# Patient Record
Sex: Female | Born: 1935 | Race: White | Hispanic: No | Smoking: Never smoker
Health system: Southern US, Community
[De-identification: ages and names within clinical notes are randomized; demographics above are authoritative.]

## PROBLEM LIST (undated history)

## (undated) DIAGNOSIS — I10 Essential (primary) hypertension: Secondary | ICD-10-CM

## (undated) DIAGNOSIS — N390 Urinary tract infection, site not specified: Secondary | ICD-10-CM

## (undated) HISTORY — PX: JOINT REPLACEMENT: SHX530

## (undated) HISTORY — PX: ABDOMINAL HYSTERECTOMY: SHX81

---

## 2014-09-23 ENCOUNTER — Emergency Department (HOSPITAL_BASED_OUTPATIENT_CLINIC_OR_DEPARTMENT_OTHER)
Admission: EM | Admit: 2014-09-23 | Discharge: 2014-09-23 | Disposition: A | Discharge status: Home / Self Care | Payer: Medicare Other | Source: Home / Self Care | Attending: Emergency Medicine | Admitting: Emergency Medicine

## 2014-09-23 ENCOUNTER — Emergency Department (HOSPITAL_BASED_OUTPATIENT_CLINIC_OR_DEPARTMENT_OTHER): Payer: Medicare Other

## 2014-09-23 ENCOUNTER — Encounter (HOSPITAL_BASED_OUTPATIENT_CLINIC_OR_DEPARTMENT_OTHER): Payer: Self-pay | Admitting: *Deleted

## 2014-09-23 DIAGNOSIS — Z8505 Personal history of malignant neoplasm of liver: Secondary | ICD-10-CM

## 2014-09-23 DIAGNOSIS — R1010 Upper abdominal pain, unspecified: Secondary | ICD-10-CM | POA: Diagnosis not present

## 2014-09-23 DIAGNOSIS — C221 Intrahepatic bile duct carcinoma: Principal | ICD-10-CM | POA: Diagnosis present

## 2014-09-23 DIAGNOSIS — R109 Unspecified abdominal pain: Secondary | ICD-10-CM | POA: Insufficient documentation

## 2014-09-23 DIAGNOSIS — I10 Essential (primary) hypertension: Secondary | ICD-10-CM | POA: Diagnosis present

## 2014-09-23 DIAGNOSIS — R1030 Lower abdominal pain, unspecified: Secondary | ICD-10-CM

## 2014-09-23 DIAGNOSIS — Z885 Allergy status to narcotic agent status: Secondary | ICD-10-CM

## 2014-09-23 DIAGNOSIS — N390 Urinary tract infection, site not specified: Secondary | ICD-10-CM | POA: Diagnosis present

## 2014-09-23 DIAGNOSIS — Z90710 Acquired absence of both cervix and uterus: Secondary | ICD-10-CM

## 2014-09-23 DIAGNOSIS — Z79899 Other long term (current) drug therapy: Secondary | ICD-10-CM

## 2014-09-23 DIAGNOSIS — K831 Obstruction of bile duct: Secondary | ICD-10-CM | POA: Diagnosis present

## 2014-09-23 DIAGNOSIS — Z87440 Personal history of urinary (tract) infections: Secondary | ICD-10-CM

## 2014-09-23 DIAGNOSIS — R110 Nausea: Secondary | ICD-10-CM

## 2014-09-23 DIAGNOSIS — K5900 Constipation, unspecified: Secondary | ICD-10-CM | POA: Diagnosis present

## 2014-09-23 DIAGNOSIS — Z79891 Long term (current) use of opiate analgesic: Secondary | ICD-10-CM

## 2014-09-23 HISTORY — DX: Essential (primary) hypertension: I10

## 2014-09-23 HISTORY — DX: Urinary tract infection, site not specified: N39.0

## 2014-09-23 LAB — CBC WITH DIFFERENTIAL/PLATELET
BASOS PCT: 0 % (ref 0–1)
Basophils Absolute: 0 10*3/uL (ref 0.0–0.1)
Eosinophils Absolute: 0 10*3/uL (ref 0.0–0.7)
Eosinophils Relative: 0 % (ref 0–5)
HCT: 37.5 % (ref 36.0–46.0)
Hemoglobin: 12.2 g/dL (ref 12.0–15.0)
LYMPHS ABS: 0.8 10*3/uL (ref 0.7–4.0)
LYMPHS PCT: 11 % — AB (ref 12–46)
MCH: 33.4 pg (ref 26.0–34.0)
MCHC: 32.5 g/dL (ref 30.0–36.0)
MCV: 102.7 fL — ABNORMAL HIGH (ref 78.0–100.0)
Monocytes Absolute: 0.7 10*3/uL (ref 0.1–1.0)
Monocytes Relative: 9 % (ref 3–12)
NEUTROS ABS: 5.8 10*3/uL (ref 1.7–7.7)
Neutrophils Relative %: 80 % — ABNORMAL HIGH (ref 43–77)
Platelets: 209 10*3/uL (ref 150–400)
RBC: 3.65 MIL/uL — ABNORMAL LOW (ref 3.87–5.11)
RDW: 12.4 % (ref 11.5–15.5)
WBC: 7.3 10*3/uL (ref 4.0–10.5)

## 2014-09-23 LAB — COMPREHENSIVE METABOLIC PANEL
ALBUMIN: 3.2 g/dL — AB (ref 3.5–5.0)
ALK PHOS: 448 U/L — AB (ref 38–126)
ALT: 139 U/L — ABNORMAL HIGH (ref 14–54)
AST: 152 U/L — ABNORMAL HIGH (ref 15–41)
Anion gap: 8 (ref 5–15)
BILIRUBIN TOTAL: 1.3 mg/dL — AB (ref 0.3–1.2)
BUN: 15 mg/dL (ref 6–20)
CO2: 28 mmol/L (ref 22–32)
Calcium: 8.9 mg/dL (ref 8.9–10.3)
Chloride: 99 mmol/L — ABNORMAL LOW (ref 101–111)
Creatinine, Ser: 0.71 mg/dL (ref 0.44–1.00)
GFR calc non Af Amer: >60 mL/min (ref >60)
Glucose, Bld: 127 mg/dL — ABNORMAL HIGH (ref 65–99)
Potassium: 4.1 mmol/L (ref 3.5–5.1)
Sodium: 135 mmol/L (ref 135–145)
TOTAL PROTEIN: 6.8 g/dL (ref 6.5–8.1)

## 2014-09-23 LAB — LIPASE, BLOOD: Lipase: 11 U/L — ABNORMAL LOW (ref 22–51)

## 2014-09-23 MED ORDER — HYDROCODONE-ACETAMINOPHEN 5-325 MG PO TABS: 1.0000 | ORAL_TABLET | Freq: Four times a day (QID) | ORAL | Status: DC | PRN

## 2014-09-23 MED ORDER — SODIUM CHLORIDE 0.9 % IV SOLN
INTRAVENOUS | Status: DC
  Administered 2014-09-23: 08:00:00 via INTRAVENOUS

## 2014-09-23 MED ORDER — FENTANYL CITRATE (PF) 100 MCG/2ML IJ SOLN
25.0000 ug | Freq: Once | INTRAMUSCULAR | Status: AC
  Administered 2014-09-23: 25 ug via INTRAVENOUS
  Filled 2014-09-23: qty 2

## 2014-09-23 MED ORDER — IOHEXOL 300 MG/ML  SOLN
100.0000 mL | Freq: Once | INTRAMUSCULAR | Status: AC | PRN
  Administered 2014-09-23: 100 mL via INTRAVENOUS

## 2014-09-23 MED ORDER — SODIUM CHLORIDE 0.9 % IV BOLUS (SEPSIS)
500.0000 mL | Freq: Once | INTRAVENOUS | Status: AC
Start: 2014-09-23 — End: 2014-09-23
  Administered 2014-09-23: 500 mL via INTRAVENOUS

## 2014-09-23 MED ORDER — ONDANSETRON HCL 4 MG/2ML IJ SOLN
4.0000 mg | Freq: Once | INTRAMUSCULAR | Status: AC
  Administered 2014-09-23: 4 mg via INTRAVENOUS
  Filled 2014-09-23: qty 2

## 2014-09-23 MED ORDER — ONDANSETRON 4 MG PO TBDP: 4.0000 mg | ORAL_TABLET | Freq: Three times a day (TID) | ORAL | Status: AC | PRN

## 2014-09-23 NOTE — ED Notes (Signed)
Family at bedside. 

## 2014-09-23 NOTE — ED Notes (Signed)
C/o pain in right lower quad to mid abd and nausea since yesterday evening. No v/d. No fever. No urinary problems

## 2014-09-23 NOTE — ED Provider Notes (Signed)
CSN: [id number]     Arrival date & time 09/23/14  0700 History   First MD Initiated Contact with Patient 09/23/14 0719     No chief complaint on file.    (Consider location/radiation/quality/duration/timing/severity/associated sxs/prior Treatment) The history is provided by the patient.   81 year old female with acute onset of right-sided abdominal pain last evening. Been constant and persistent associated with nausea no vomiting. No fevers. Patient states that the pain is 8 out of 10. Kind of achy in nature but sometimes sharp. Patient's had a hysterectomy. Gallbladder is still present.  Past Medical History  Diagnosis Date  . UTI (urinary tract infection)   . Hypertension    Past Surgical History  Procedure Laterality Date  . Abdominal hysterectomy    . Joint replacement     No family history on file. History  Substance Use Topics  . Smoking status: Never Smoker   . Smokeless tobacco: Not on file  . Alcohol Use: Not on file   OB History    No data available     Review of Systems  Constitutional: Negative for fever.  HENT: Negative for congestion.   Eyes: Negative for redness.  Respiratory: Negative for shortness of breath.   Cardiovascular: Negative for chest pain.  Gastrointestinal: Positive for nausea and abdominal pain. Negative for vomiting and diarrhea.  Genitourinary: Negative for dysuria.  Musculoskeletal: Negative for back pain.  Skin: Negative for rash.  Neurological: Negative for headaches.  Hematological: Does not bruise/bleed easily.  Psychiatric/Behavioral: Negative for confusion.      Allergies  Hydrocodone bitartrate er  Home Medications   Prior to Admission medications   Medication Sig Start Date End Date Taking? Authorizing Provider  HYDROcodone-acetaminophen (NORCO/VICODIN) 5-325 MG per tablet Take 1-2 tablets by mouth every 6 (six) hours as needed for moderate pain. 09/23/14   Fredia Sorrow, MD  ondansetron (ZOFRAN ODT) 4 MG disintegrating  tablet Take 1 tablet (4 mg total) by mouth every 8 (eight) hours as needed for nausea or vomiting. 09/23/14   Fredia Sorrow, MD   BP 173/67 mmHg  Pulse 61  Temp(Src) 98.3 F (36.8 C) (Oral)  Resp 18  Ht 5\' 7"  (1.702 m)  Wt 183 lb (83.008 kg)  BMI 28.66 kg/m2  SpO2 100% Physical Exam  Constitutional: She is oriented to person, place, and time. She appears well-developed and well-nourished. No distress.  HENT:  Head: Normocephalic and atraumatic.  Mouth/Throat: Oropharynx is clear and moist.  Eyes: Conjunctivae and EOM are normal. Pupils are equal, round, and reactive to light.  Neck: Normal range of motion.  Cardiovascular: Normal rate, regular rhythm and normal heart sounds.   Pulmonary/Chest: Effort normal and breath sounds normal. No respiratory distress.  Abdominal: Soft. Bowel sounds are normal. There is tenderness.  Mild tenderness right side of the abdomen not truly right upper quadrant not truly right lower quadrant. No guarding. No tenderness over palpation of the liver.  Musculoskeletal: Normal range of motion.  Neurological: She is alert and oriented to person, place, and time. No cranial nerve deficit. She exhibits normal muscle tone. Coordination normal.  Skin: Skin is warm. No rash noted.  Nursing note and vitals reviewed.   ED Course  Procedures (including critical care time) Labs Review Labs Reviewed  LIPASE, BLOOD - Abnormal; Notable for the following:    Lipase 11 (*)    All other components within normal limits  COMPREHENSIVE METABOLIC PANEL - Abnormal; Notable for the following:    Chloride 99 (*)  Glucose, Bld 127 (*)    Albumin 3.2 (*)    AST 152 (*)    ALT 139 (*)    Alkaline Phosphatase 448 (*)    Total Bilirubin 1.3 (*)    All other components within normal limits  CBC WITH DIFFERENTIAL/PLATELET - Abnormal; Notable for the following:    RBC 3.65 (*)    MCV 102.7 (*)    Neutrophils Relative % 80 (*)    Lymphocytes Relative 11 (*)    All other  components within normal limits   Results for orders placed or performed during the hospital encounter of 09/23/14  Lipase, blood  Result Value Ref Range   Lipase 11 (L) 22 - 51 U/L  Comprehensive metabolic panel  Result Value Ref Range   Sodium 135 135 - 145 mmol/L   Potassium 4.1 3.5 - 5.1 mmol/L   Chloride 99 (L) 101 - 111 mmol/L   CO2 28 22 - 32 mmol/L   Glucose, Bld 127 (H) 65 - 99 mg/dL   BUN 15 6 - 20 mg/dL   Creatinine, Ser 0.71 0.44 - 1.00 mg/dL   Calcium 8.9 8.9 - 10.3 mg/dL   Total Protein 6.8 6.5 - 8.1 g/dL   Albumin 3.2 (L) 3.5 - 5.0 g/dL   AST 152 (H) 15 - 41 U/L   ALT 139 (H) 14 - 54 U/L   Alkaline Phosphatase 448 (H) 38 - 126 U/L   Total Bilirubin 1.3 (H) 0.3 - 1.2 mg/dL   GFR calc non Af Amer >60 >60 mL/min   GFR calc Af Amer >60 >60 mL/min   Anion gap 8 5 - 15  CBC with Differential/Platelet  Result Value Ref Range   WBC 7.3 4.0 - 10.5 K/uL   RBC 3.65 (L) 3.87 - 5.11 MIL/uL   Hemoglobin 12.2 12.0 - 15.0 g/dL   HCT 37.5 36.0 - 46.0 %   MCV 102.7 (H) 78.0 - 100.0 fL   MCH 33.4 26.0 - 34.0 pg   MCHC 32.5 30.0 - 36.0 g/dL   RDW 12.4 11.5 - 15.5 %   Platelets 209 150 - 400 K/uL   Neutrophils Relative % 80 (H) 43 - 77 %   Neutro Abs 5.8 1.7 - 7.7 K/uL   Lymphocytes Relative 11 (L) 12 - 46 %   Lymphs Abs 0.8 0.7 - 4.0 K/uL   Monocytes Relative 9 3 - 12 %   Monocytes Absolute 0.7 0.1 - 1.0 K/uL   Eosinophils Relative 0 0 - 5 %   Eosinophils Absolute 0.0 0.0 - 0.7 K/uL   Basophils Relative 0 0 - 1 %   Basophils Absolute 0.0 0.0 - 0.1 K/uL     Imaging Review Ct Abdomen Pelvis W Contrast  09/23/2014   CLINICAL DATA:  Right lower quadrant abdominal pain with nausea since last night. Initial encounter.  EXAM: CT ABDOMEN AND PELVIS WITH CONTRAST  TECHNIQUE: Multidetector CT imaging of the abdomen and pelvis was performed using the standard protocol following bolus administration of intravenous contrast.  CONTRAST:  152mL OMNIPAQUE IOHEXOL 300 MG/ML  SOLN   COMPARISON:  None.  FINDINGS: Lower chest: Clear lung bases. No significant pleural or pericardial effusion.  Hepatobiliary: There is prominent segmental intrahepatic biliary dilatation anteriorly in the right hepatic lobe. There is heterogeneous enhancement within segments 4B and 5, measuring 5.1 x 3.6 cm on image 24, likely altered perfusion from the biliary dilatation as the dilated bile ducts pass through this lesion. A peripheral hypervascular mass is  considered less likely. There is suspicion of a central liver mass causing biliary obstruction, potentially intraductal. This is most obvious on sagittal images 44 through 52. No other focal hepatic lesions identified. The gallbladder appears normal without gallstones or wall thickening. The extrahepatic biliary system is nondilated.  Pancreas: Unremarkable. No pancreatic ductal dilatation or surrounding inflammatory changes.  Spleen: Normal in size without focal abnormality.  Adrenals/Urinary Tract: Both adrenal glands appear normal.Both kidneys demonstrate parapelvic cysts. There is no evidence of renal mass, hydronephrosis or urinary tract calculus. The bladder appears unremarkable.  Stomach/Bowel: No evidence of bowel wall thickening, distention or surrounding inflammatory change.There is a possible small appendiceal stump suggesting previous appendectomy. No pericecal inflammatory changes identified.  Vascular/Lymphatic: Several small lymph nodes within the porta hepatis are not pathologically enlarged and are nonspecific. There is no retroperitoneal or pelvic lymphadenopathy. There is moderate atherosclerosis of the aorta, its branches and the iliac arteries.  Reproductive: Status post hysterectomy. No evidence of adnexal mass.  Other: No evidence of abdominal wall mass or hernia.  Musculoskeletal: No acute or significant osseous findings. Mild lumbar spondylosis noted.  IMPRESSION: 1. Segmental intrahepatic biliary dilatation highly suspicious for central  cholangiocarcinoma. Focal hypervascular lesion within segments 4B and 5 is likely due to altered perfusion, although potentially a peripheral mass lesion. Further evaluation with MRCP without and with contrast recommended. 2. No distant metastases identified. There are not specific small lymph nodes within the porta hepatis. 3. No acute inflammatory changes identified. Suspected previous appendectomy at time of hysterectomy. 4. Moderate atherosclerosis.   Electronically Signed   By: Richardean Sale M.D.   On: 09/23/2014 09:23     EKG Interpretation   Date/Time:  Thursday September 23 2014 08:07:08 EDT Ventricular Rate:  58 PR Interval:  168 QRS Duration: 132 QT Interval:  476 QTC Calculation: 467 R Axis:   80 Text Interpretation:  Sinus bradycardia Right bundle branch block Abnormal  ECG No previous ECGs available Confirmed by Elantra Caprara  MD, Mayleen Borrero ([id number])  on 09/23/2014 8:32:50 AM      MDM   Final diagnoses:  Abdominal pain   Additional history obtained from the daughter. Patient is having discomfort on the right side of the abdomen right upper quadrant for several months. Patient's primary care doctor had done an ultrasound of the gallbladder area without any acute findings. Patient's had elevated and abnormal liver function tests now for several months. Patient's had pain in this area but just worse and more constant starting last night.  CT of the area raises concerns for intrahepatic biliary dilatation suspicious for central cholangiocarcinoma. No lymph nodes in the area. Patient does have a GI doctor in [facility]. Patient will call them for either ERCP or perhaps MRI of the area. Patient will also notify her primary care doctor.  Patient will be treated with pain medicine and antinausea medicine.  No evidence of any gallstones no evidence of appendicitis. Actually appendix probably was removed when they did her hysterectomy.     Fredia Sorrow, MD 09/23/14 1007

## 2014-09-23 NOTE — ED Notes (Signed)
Patient is resting comfortably. 

## 2014-09-23 NOTE — Discharge Instructions (Signed)
Take the pain medication and antinausea medicine as directed. And as needed.  Call your GI doctor for follow-up. Probably need an ERCP. Also notify your primary care doctor at the CT findings.

## 2014-09-24 ENCOUNTER — Encounter (HOSPITAL_COMMUNITY): Payer: Self-pay | Admitting: *Deleted

## 2014-09-24 ENCOUNTER — Inpatient Hospital Stay (HOSPITAL_COMMUNITY)
Admission: EM | Admit: 2014-09-24 | Discharge: 2014-09-28 | DRG: 435 | Disposition: A | Discharge status: Home Health | Payer: Medicare Other | Attending: Internal Medicine | Admitting: Internal Medicine

## 2014-09-24 DIAGNOSIS — I10 Essential (primary) hypertension: Secondary | ICD-10-CM | POA: Diagnosis present

## 2014-09-24 DIAGNOSIS — R7989 Other specified abnormal findings of blood chemistry: Secondary | ICD-10-CM | POA: Insufficient documentation

## 2014-09-24 DIAGNOSIS — K831 Obstruction of bile duct: Secondary | ICD-10-CM | POA: Diagnosis present

## 2014-09-24 DIAGNOSIS — N390 Urinary tract infection, site not specified: Secondary | ICD-10-CM | POA: Diagnosis present

## 2014-09-24 DIAGNOSIS — Z79899 Other long term (current) drug therapy: Secondary | ICD-10-CM | POA: Diagnosis not present

## 2014-09-24 DIAGNOSIS — K5900 Constipation, unspecified: Secondary | ICD-10-CM | POA: Diagnosis present

## 2014-09-24 DIAGNOSIS — R945 Abnormal results of liver function studies: Secondary | ICD-10-CM

## 2014-09-24 DIAGNOSIS — C221 Intrahepatic bile duct carcinoma: Secondary | ICD-10-CM | POA: Diagnosis present

## 2014-09-24 DIAGNOSIS — R1110 Vomiting, unspecified: Secondary | ICD-10-CM | POA: Diagnosis not present

## 2014-09-24 DIAGNOSIS — R1010 Upper abdominal pain, unspecified: Secondary | ICD-10-CM

## 2014-09-24 DIAGNOSIS — R1084 Generalized abdominal pain: Secondary | ICD-10-CM | POA: Diagnosis not present

## 2014-09-24 DIAGNOSIS — Z8505 Personal history of malignant neoplasm of liver: Secondary | ICD-10-CM | POA: Diagnosis not present

## 2014-09-24 DIAGNOSIS — R112 Nausea with vomiting, unspecified: Secondary | ICD-10-CM | POA: Diagnosis present

## 2014-09-24 DIAGNOSIS — R1011 Right upper quadrant pain: Secondary | ICD-10-CM

## 2014-09-24 DIAGNOSIS — Z885 Allergy status to narcotic agent status: Secondary | ICD-10-CM | POA: Diagnosis not present

## 2014-09-24 DIAGNOSIS — Z79891 Long term (current) use of opiate analgesic: Secondary | ICD-10-CM | POA: Diagnosis not present

## 2014-09-24 DIAGNOSIS — R109 Unspecified abdominal pain: Secondary | ICD-10-CM | POA: Diagnosis present

## 2014-09-24 LAB — CBC WITH DIFFERENTIAL/PLATELET
BASOS PCT: 0 % (ref 0–1)
Basophils Absolute: 0 10*3/uL (ref 0.0–0.1)
Eosinophils Absolute: 0 10*3/uL (ref 0.0–0.7)
Eosinophils Relative: 0 % (ref 0–5)
HCT: 39 % (ref 36.0–46.0)
Hemoglobin: 13.1 g/dL (ref 12.0–15.0)
LYMPHS ABS: 1.1 10*3/uL (ref 0.7–4.0)
LYMPHS PCT: 11 % — AB (ref 12–46)
MCH: 34 pg (ref 26.0–34.0)
MCHC: 33.6 g/dL (ref 30.0–36.0)
MCV: 101.3 fL — AB (ref 78.0–100.0)
MONOS PCT: 13 % — AB (ref 3–12)
Monocytes Absolute: 1.3 10*3/uL — ABNORMAL HIGH (ref 0.1–1.0)
Neutro Abs: 7.5 10*3/uL (ref 1.7–7.7)
Neutrophils Relative %: 76 % (ref 43–77)
Platelets: 217 10*3/uL (ref 150–400)
RBC: 3.85 MIL/uL — ABNORMAL LOW (ref 3.87–5.11)
RDW: 13 % (ref 11.5–15.5)
WBC: 9.9 10*3/uL (ref 4.0–10.5)

## 2014-09-24 LAB — URINALYSIS, ROUTINE W REFLEX MICROSCOPIC
Glucose, UA: NEGATIVE mg/dL
Hgb urine dipstick: NEGATIVE
KETONES UR: 15 mg/dL — AB
NITRITE: POSITIVE — AB
PH: 5.5 (ref 5.0–8.0)
PROTEIN: NEGATIVE mg/dL
Specific Gravity, Urine: 1.019 (ref 1.005–1.030)
Urobilinogen, UA: 4 mg/dL — ABNORMAL HIGH (ref 0.0–1.0)

## 2014-09-24 LAB — COMPREHENSIVE METABOLIC PANEL
ALBUMIN: 3 g/dL — AB (ref 3.5–5.0)
ALK PHOS: 440 U/L — AB (ref 38–126)
ALT: 141 U/L — ABNORMAL HIGH (ref 14–54)
ANION GAP: 10 (ref 5–15)
AST: 159 U/L — AB (ref 15–41)
BUN: 9 mg/dL (ref 6–20)
CO2: 28 mmol/L (ref 22–32)
CREATININE: 0.78 mg/dL (ref 0.44–1.00)
Calcium: 9.1 mg/dL (ref 8.9–10.3)
Chloride: 95 mmol/L — ABNORMAL LOW (ref 101–111)
GFR calc non Af Amer: >60 mL/min (ref >60)
GLUCOSE: 115 mg/dL — AB (ref 65–99)
POTASSIUM: 4 mmol/L (ref 3.5–5.1)
Sodium: 133 mmol/L — ABNORMAL LOW (ref 135–145)
TOTAL PROTEIN: 6.6 g/dL (ref 6.5–8.1)
Total Bilirubin: 4.1 mg/dL — ABNORMAL HIGH (ref 0.3–1.2)

## 2014-09-24 LAB — URINE MICROSCOPIC-ADD ON

## 2014-09-24 LAB — TSH: TSH: 2.065 u[IU]/mL (ref 0.350–4.500)

## 2014-09-24 LAB — LIPASE, BLOOD: Lipase: 16 U/L — ABNORMAL LOW (ref 22–51)

## 2014-09-24 MED ORDER — ONDANSETRON HCL 4 MG/2ML IJ SOLN
4.0000 mg | Freq: Four times a day (QID) | INTRAMUSCULAR | Status: DC | PRN
  Administered 2014-09-24 – 2014-09-28 (×7): 4 mg via INTRAVENOUS
  Filled 2014-09-24 (×8): qty 2

## 2014-09-24 MED ORDER — ACETAMINOPHEN 325 MG PO TABS: 650.0000 mg | ORAL_TABLET | Freq: Four times a day (QID) | ORAL | Status: DC | PRN

## 2014-09-24 MED ORDER — ONDANSETRON HCL 4 MG/2ML IJ SOLN
4.0000 mg | Freq: Once | INTRAMUSCULAR | Status: AC
  Administered 2014-09-24: 4 mg via INTRAVENOUS
  Filled 2014-09-24: qty 2

## 2014-09-24 MED ORDER — ONDANSETRON HCL 4 MG PO TABS: 4.0000 mg | ORAL_TABLET | Freq: Four times a day (QID) | ORAL | Status: DC | PRN

## 2014-09-24 MED ORDER — MORPHINE SULFATE 4 MG/ML IJ SOLN
4.0000 mg | Freq: Once | INTRAMUSCULAR | Status: AC
  Administered 2014-09-24: 4 mg via INTRAVENOUS
  Filled 2014-09-24: qty 1

## 2014-09-24 MED ORDER — OXYCODONE HCL 5 MG PO TABS
5.0000 mg | ORAL_TABLET | ORAL | Status: DC | PRN
  Administered 2014-09-25 – 2014-09-26 (×5): 5 mg via ORAL
  Filled 2014-09-24 (×5): qty 1

## 2014-09-24 MED ORDER — HYDROMORPHONE HCL 1 MG/ML IJ SOLN
1.0000 mg | INTRAMUSCULAR | Status: DC | PRN
  Administered 2014-09-24 – 2014-09-28 (×16): 1 mg via INTRAVENOUS
  Filled 2014-09-24 (×16): qty 1

## 2014-09-24 MED ORDER — VERAPAMIL HCL 180 MG (CO) PO TB24: 180.0000 mg | ORAL_TABLET | Freq: Every day | ORAL | Status: DC

## 2014-09-24 MED ORDER — VERAPAMIL HCL ER 180 MG PO TBCR
180.0000 mg | EXTENDED_RELEASE_TABLET | Freq: Every day | ORAL | Status: DC
  Administered 2014-09-24 – 2014-09-27 (×4): 180 mg via ORAL
  Filled 2014-09-24 (×5): qty 1

## 2014-09-24 MED ORDER — SODIUM CHLORIDE 0.9 % IV SOLN
INTRAVENOUS | Status: DC
  Administered 2014-09-24 – 2014-09-27 (×3): via INTRAVENOUS

## 2014-09-24 MED ORDER — ACETAMINOPHEN 650 MG RE SUPP: 650.0000 mg | Freq: Four times a day (QID) | RECTAL | Status: DC | PRN

## 2014-09-24 MED ORDER — ALUM & MAG HYDROXIDE-SIMETH 200-200-20 MG/5ML PO SUSP: 30.0000 mL | Freq: Four times a day (QID) | ORAL | Status: DC | PRN

## 2014-09-24 NOTE — ED Notes (Signed)
Pt reports upper abd pain x 2 days with nausea, no vomiting. Was here yesterday for same and reports being diagnosed with possible liver cancer.

## 2014-09-24 NOTE — Consult Note (Signed)
Referring Provider: Dr. Darl Householder Primary Care Physician:  Idamae Schuller, MD Primary Gastroenterologist:  Althia Forts  Reason for Consultation:  Cholangiocarcinoma; Elevated LFTs; Abdominal pain  HPI: Krista Bentley is a pleasant 81 y.o. female who has been having pruritus for the past 2 months and a 10 pound unintentional weight loss over the past 12 months. Starting the first of this week she has been having epigastric and RUQ pain that has been a constant ache. +Nausea this week without vomiting. +Weakness all week. Poor appetite over the past 2 weeks. Denies fevers or chills. Denies jaundice until today and also noticed tea-colored urine today. Has developed constipation over the last few weeks and has been taking a stool softener to help with that. Denies melena, hematochezia. Was in the ER yesterday and TB 1.3, ALP 448, AST 152, ALT 139 and a CT scan showed segmental intrahepatic biliary dilation concerning for a central cholangiocarcinoma. She came back to the hospital today due to worsening abdominal pain and her TB had increased to 4.1, ALP 440, AST 159, ALT 141. Lipase normal at 16.  +Heartburn that is controlled with Protonix 40 mg BID. Denies dysphagia. Reports chest pain in the ER pointing to her left lower chest but denies any now. Has been having shortness of breath for the last few months. Reports having a normal EGD in [place] a few months ago that was done for dysphagia but denies any dysphagia since around that time. Reports 1 or 2 benign polyps being taken out on a colonoscopy a few months ago. Denies family history of liver cancer or biliary tract cancer.    Past Medical History  Diagnosis Date  . UTI (urinary tract infection)   . Hypertension     Past Surgical History  Procedure Laterality Date  . Abdominal hysterectomy    . Joint replacement      Prior to Admission medications   Medication Sig Start Date End Date Taking? Authorizing Provider  dicyclomine (BENTYL) 10 MG  capsule Take 10 mg by mouth at bedtime.   Yes Historical Provider, MD  estrogens, conjugated, (PREMARIN) 0.625 MG tablet Take 0.625 mg by mouth See admin instructions. Take daily for 21 days then do not take for 7 days.. Take on Mondays wednesdays and fridays   Yes Historical Provider, MD  meloxicam (MOBIC) 7.5 MG tablet Take 7.5 mg by mouth at bedtime.   Yes Historical Provider, MD  ondansetron (ZOFRAN ODT) 4 MG disintegrating tablet Take 1 tablet (4 mg total) by mouth every 8 (eight) hours as needed for nausea or vomiting. 09/23/14  Yes Fredia Sorrow, MD  pantoprazole (PROTONIX) 40 MG tablet Take 40 mg by mouth 2 (two) times daily.   Yes Historical Provider, MD  simvastatin (ZOCOR) 40 MG tablet Take 40 mg by mouth every evening.   Yes Historical Provider, MD  traMADol (ULTRAM) 50 MG tablet Take 50 mg by mouth 2 (two) times daily as needed for moderate pain.   Yes Historical Provider, MD  verapamil (COVERA HS) 180 MG (CO) 24 hr tablet Take 180 mg by mouth at bedtime.   Yes Historical Provider, MD  HYDROcodone-acetaminophen (NORCO/VICODIN) 5-325 MG per tablet Take 1-2 tablets by mouth every 6 (six) hours as needed for moderate pain. Patient not taking: Reported on 09/24/2014 09/23/14   Fredia Sorrow, MD  ondansetron (ZOFRAN) 4 MG tablet Take 4 mg by mouth every 8 (eight) hours as needed for nausea or vomiting.    Historical Provider, MD    Scheduled Meds: . verapamil  180 mg Oral QHS   Continuous Infusions: . sodium chloride 75 mL/hr at 09/24/14 2034   PRN Meds:.acetaminophen **OR** acetaminophen, alum & mag hydroxide-simeth, HYDROmorphone (DILAUDID) injection, ondansetron **OR** ondansetron (ZOFRAN) IV, oxyCODONE  Allergies as of 09/24/2014 - Review Complete 09/24/2014  Allergen Reaction Noted  . Hydrocodone bitartrate er Rash 09/23/2014    History reviewed. No pertinent family history.  History   Social History  . Marital Status: Unknown    Spouse Name: N/A  . Number of Children:  N/A  . Years of Education: N/A   Occupational History  . Not on file.   Social History Main Topics  . Smoking status: Never Smoker   . Smokeless tobacco: Not on file  . Alcohol Use: Not on file  . Drug Use: Not on file  . Sexual Activity: Not on file   Other Topics Concern  . Not on file   Social History Narrative    Review of Systems: Positive in bold: Gen: Denies any fever, chills, rigors, anorexia, fatigue, weakness, malaise, involuntary weight loss CV: Denies chest pain Resp: Denies dyspnea, cough GI: negative except as stated above in HPI  GU : +tea-colored urine MS: Denies joint pain or swelling.  Derm: Denies rash, itching Psych: Denies confusion. Heme: Denies bruising, bleeding Neuro:  Denies any headaches, dizziness Endo:  Denies any problems with DM  Physical Exam: Vital signs in last 24 hours: Temp:  [98.2 F (36.8 C)] 98.2 F (36.8 C) (06/10 1552) Pulse Rate:  [59-68] 64 (06/10 1857) Resp:  [11-24] 18 (06/10 1857) BP: (160-182)/(54-69) 181/63 mmHg (06/10 1857) SpO2:  [94 %-99 %] 97 % (06/10 1857) Weight:  [83.008 kg (183 lb)] 83.008 kg (183 lb) (06/10 1552)   General:   Lethargic, Well-developed, well-nourished, pleasant and cooperative in NAD Head:  Normocephalic and atraumatic. Eyes:  +scleral icterus.   Conjunctiva pink. Ears:  Normal auditory acuity. Nose:  No deformity, discharge,  or lesions. Mouth:  No deformity or lesions.  Oropharynx pink & moist. Neck:  Supple; no masses or thyromegaly. Lungs:  Clear throughout to auscultation.   No wheezes, crackles, or rhonchi. No acute distress. Heart:  Regular rate and rhythm; no murmurs, clicks, rubs,  or gallops. Abdomen:  Epigastric and RUQ tenderness with guarding, lower abdominal tenderness without guarding, soft, nondistended, +BS, no rebound, hepatic edge not palpable   Rectal:  Deferred Msk:  Symmetrical without gross deformities. Normal posture. Pulses:  Normal pulses noted. Extremities:   Without clubbing or edema. Neurologic:  Alert and  oriented x4;  grossly normal neurologically. Skin:  Intact without significant lesions or rashes. Psych:  Alert and cooperative. Normal mood and affect.   Lab Results:  Recent Labs  09/23/14 0750 09/24/14 1612  WBC 7.3 9.9  HGB 12.2 13.1  HCT 37.5 39.0  PLT 209 217   BMET  Recent Labs  09/23/14 0750 09/24/14 1612  NA 135 133*  K 4.1 4.0  CL 99* 95*  CO2 28 28  GLUCOSE 127* 115*  BUN 15 9  CREATININE 0.71 0.78  CALCIUM 8.9 9.1   LFT Lab Results  Component Value Date   ALT 141* 09/24/2014   AST 159* 09/24/2014   ALKPHOS 440* 09/24/2014   BILITOT 4.1* 09/24/2014    Recent Labs  09/24/14 1612  PROT 6.6  ALBUMIN 3.0*  AST 159*  ALT 141*  ALKPHOS 440*  BILITOT 4.1*   PT/INR No results for input(s): LABPROT, INR in the last 72 hours.   Studies/Results: Ct Abdomen  Pelvis W Contrast  09/23/2014   CLINICAL DATA:  Right lower quadrant abdominal pain with nausea since last night. Initial encounter.  EXAM: CT ABDOMEN AND PELVIS WITH CONTRAST  TECHNIQUE: Multidetector CT imaging of the abdomen and pelvis was performed using the standard protocol following bolus administration of intravenous contrast.  CONTRAST:  156mL OMNIPAQUE IOHEXOL 300 MG/ML  SOLN  COMPARISON:  None.  FINDINGS: Lower chest: Clear lung bases. No significant pleural or pericardial effusion.  Hepatobiliary: There is prominent segmental intrahepatic biliary dilatation anteriorly in the right hepatic lobe. There is heterogeneous enhancement within segments 4B and 5, measuring 5.1 x 3.6 cm on image 24, likely altered perfusion from the biliary dilatation as the dilated bile ducts pass through this lesion. A peripheral hypervascular mass is considered less likely. There is suspicion of a central liver mass causing biliary obstruction, potentially intraductal. This is most obvious on sagittal images 44 through 52. No other focal hepatic lesions identified. The  gallbladder appears normal without gallstones or wall thickening. The extrahepatic biliary system is nondilated.  Pancreas: Unremarkable. No pancreatic ductal dilatation or surrounding inflammatory changes.  Spleen: Normal in size without focal abnormality.  Adrenals/Urinary Tract: Both adrenal glands appear normal.Both kidneys demonstrate parapelvic cysts. There is no evidence of renal mass, hydronephrosis or urinary tract calculus. The bladder appears unremarkable.  Stomach/Bowel: No evidence of bowel wall thickening, distention or surrounding inflammatory change.There is a possible small appendiceal stump suggesting previous appendectomy. No pericecal inflammatory changes identified.  Vascular/Lymphatic: Several small lymph nodes within the porta hepatis are not pathologically enlarged and are nonspecific. There is no retroperitoneal or pelvic lymphadenopathy. There is moderate atherosclerosis of the aorta, its branches and the iliac arteries.  Reproductive: Status post hysterectomy. No evidence of adnexal mass.  Other: No evidence of abdominal wall mass or hernia.  Musculoskeletal: No acute or significant osseous findings. Mild lumbar spondylosis noted.  IMPRESSION: 1. Segmental intrahepatic biliary dilatation highly suspicious for central cholangiocarcinoma. Focal hypervascular lesion within segments 4B and 5 is likely due to altered perfusion, although potentially a peripheral mass lesion. Further evaluation with MRCP without and with contrast recommended. 2. No distant metastases identified. There are not specific small lymph nodes within the porta hepatis. 3. No acute inflammatory changes identified. Suspected previous appendectomy at time of hysterectomy. 4. Moderate atherosclerosis.   Electronically Signed   By: Richardean Sale M.D.   On: 09/23/2014 09:23    Impression/Plan: 81 yo with obstructive jaundice and CT concerning for cholangiocarcinoma. Total bilirubin has increased from yesterday but other  LFTs have not changed. MRCP is needed to further evaluate the biliary tree and this central mass. If her LFTs continue to rise, then may need an ERCP with stent placement but if her LFTs improve, then could do ERCP as an outpt. Risks/benefits of an ERCP were discussed with the patient in case it is necessary and she agrees to proceed if necessary but at this time will do MRCP and follow LFTs. Supportive care. Clear liquid diet. NPO p MN until MRCP has been done. Thank you for this consultation.    LOS: 0 days   Benton C.  09/24/2014, 9:06 PM  Pager [phone]  If no answer or after 5 PM call [phone]

## 2014-09-24 NOTE — H&P (Signed)
[facility] History and Physical  Krista Bentley BDZ:[id number] DOB: April 27, 1933 DOA: 09/24/2014  Referring physician:  PCP: Idamae Schuller, MD   Chief Complaint: Abdominal pain  HPI: Krista Bentley is a 81 y.o. female patient is a pleasant 81 year old female with past medical history of hypertension presenting to the emergency department with complaints of abdominal pain. She reports expansion abdominal pain for the past 5 days located in the epigastric and right upper quadrant regions. She describes this pain as constant, crampy-like, without alleviating or precipitating factors. She complains of associated nausea though denies significant vomiting. She also complains of having generalized pruritus. She states losing about 10 pounds over the past year, denies fevers, chills, hematemesis, bloody stools, melanoma, dysuria, hematuria. She was seen at [facility] yesterday with the above-mentioned complaints where a CT scan of abdomen and pelvis revealed intrahepatic biliary dilatation highly suspicious for central cholangiocarcinoma. She was given follow-up visit from there. She presents to the emergency department today with complaints of ongoing pain symptoms despite taking oral narcotic analgesics. Emergency room provider discussed case with GI who will consult.                                     Review of Systems:  Constitutional:  No weight loss, night sweats, Fevers, chills, fatigue.  HEENT:  No headaches, Difficulty swallowing,Tooth/dental problems,Sore throat,  No sneezing, itching, ear ache, nasal congestion, post nasal drip,  Cardio-vascular:  No chest pain, Orthopnea, PND, swelling in lower extremities, anasarca, dizziness, palpitations  GI:  No heartburn, indigestion, positive for abdominal pain, nausea, vomiting, denies diarrhea, change in bowel habits, loss of appetite  Resp:  No shortness of breath with exertion or at rest. No excess mucus, no productive cough, No  non-productive cough, No coughing up of blood.No change in color of mucus.No wheezing.No chest wall deformity  Skin:  no rash or lesions.  GU:  no dysuria, change in color of urine, no urgency or frequency. No flank pain.  Musculoskeletal:  No joint pain or swelling. No decreased range of motion. No back pain.  Psych:  No change in mood or affect. No depression or anxiety. No memory loss.   Past Medical History  Diagnosis Date  . UTI (urinary tract infection)   . Hypertension    Past Surgical History  Procedure Laterality Date  . Abdominal hysterectomy    . Joint replacement     Social History:  reports that she has never smoked. She does not have any smokeless tobacco history on file. Her alcohol and drug histories are not on file.  Allergies  Allergen Reactions  . Hydrocodone Bitartrate Er Rash    History reviewed. No pertinent family history.   Prior to Admission medications   Medication Sig Start Date End Date Taking? Authorizing Provider  dicyclomine (BENTYL) 10 MG capsule Take 10 mg by mouth at bedtime.   Yes Historical Provider, MD  estrogens, conjugated, (PREMARIN) 0.45 MG tablet Take 0.45 mg by mouth daily. Take daily for 21 days then do not take for 7 days.   Yes Historical Provider, MD  estrogens, conjugated, (PREMARIN) 0.625 MG tablet Take 0.625 mg by mouth See admin instructions. Take daily for 21 days then do not take for 7 days.. Take on Mondays wednesdays and fridays   Yes Historical Provider, MD  meloxicam (MOBIC) 7.5 MG tablet Take 7.5 mg by mouth at bedtime.   Yes Historical Provider, MD  ondansetron (ZOFRAN) 4 MG tablet Take 4 mg by mouth every 8 (eight) hours as needed for nausea or vomiting.   Yes Historical Provider, MD  pantoprazole (PROTONIX) 40 MG tablet Take 40 mg by mouth 2 (two) times daily.   Yes Historical Provider, MD  simvastatin (ZOCOR) 40 MG tablet Take 40 mg by mouth every evening.   Yes Historical Provider, MD  traMADol (ULTRAM) 50 MG tablet  Take 50 mg by mouth 2 (two) times daily as needed for moderate pain.   Yes Historical Provider, MD  verapamil (COVERA HS) 180 MG (CO) 24 hr tablet Take 180 mg by mouth at bedtime.   Yes Historical Provider, MD  HYDROcodone-acetaminophen (NORCO/VICODIN) 5-325 MG per tablet Take 1-2 tablets by mouth every 6 (six) hours as needed for moderate pain. Patient not taking: Reported on 09/24/2014 09/23/14   Fredia Sorrow, MD  ondansetron (ZOFRAN ODT) 4 MG disintegrating tablet Take 1 tablet (4 mg total) by mouth every 8 (eight) hours as needed for nausea or vomiting. Patient not taking: Reported on 09/24/2014 09/23/14   Fredia Sorrow, MD   Physical Exam: Filed Vitals:   09/24/14 1745 09/24/14 1800 09/24/14 1815 09/24/14 1830  BP: 171/54 168/56 172/61 168/61  Pulse: 61 61 62 68  Temp:      TempSrc:      Resp: 11 15 16 18   Weight:      SpO2: 97% 97% 95% 99%    Wt Readings from Last 3 Encounters:  09/24/14 83.008 kg (183 lb)  09/23/14 83.008 kg (183 lb)    General:  She is awake and alert, oriented 3, does not appear to be in acute distress. Eyes: PERRL, normal lids, irises, scleral icterus is present ENT: grossly normal hearing, lips & tongue, dry oral mucosa Neck: no LAD, masses or thyromegaly Cardiovascular: RRR, no m/r/g. No LE edema. Telemetry: SR, no arrhythmias  Respiratory: CTA bilaterally, no w/r/r. Normal respiratory effort. Abdomen: soft, nondistended, she has pain to palpation over the epigastric region and right upper quadrant, there is no rebound tenderness or guarding, no peritoneal signs evident Skin: no rash or induration seen on limited exam Musculoskeletal: grossly normal tone BUE/BLE Psychiatric: grossly normal mood and affect, speech fluent and appropriate Neurologic: grossly non-focal.          Labs on Admission:  Basic Metabolic Panel:  Recent Labs Lab 09/23/14 0750 09/24/14 1612  NA 135 133*  K 4.1 4.0  CL 99* 95*  CO2 28 28  GLUCOSE 127* 115*  BUN 15 9    CREATININE 0.71 0.78  CALCIUM 8.9 9.1   Liver Function Tests:  Recent Labs Lab 09/23/14 0750 09/24/14 1612  AST 152* 159*  ALT 139* 141*  ALKPHOS 448* 440*  BILITOT 1.3* 4.1*  PROT 6.8 6.6  ALBUMIN 3.2* 3.0*    Recent Labs Lab 09/23/14 0750 09/24/14 1612  LIPASE 11* 16*   No results for input(s): AMMONIA in the last 168 hours. CBC:  Recent Labs Lab 09/23/14 0750 09/24/14 1612  WBC 7.3 9.9  NEUTROABS 5.8 7.5  HGB 12.2 13.1  HCT 37.5 39.0  MCV 102.7* 101.3*  PLT 209 217   Cardiac Enzymes: No results for input(s): CKTOTAL, CKMB, CKMBINDEX, TROPONINI in the last 168 hours.  BNP (last 3 results) No results for input(s): BNP in the last 8760 hours.  ProBNP (last 3 results) No results for input(s): PROBNP in the last 8760 hours.  CBG: No results for input(s): GLUCAP in the last 168 hours.  Radiological  Exams on Admission: Ct Abdomen Pelvis W Contrast  09/23/2014   CLINICAL DATA:  Right lower quadrant abdominal pain with nausea since last night. Initial encounter.  EXAM: CT ABDOMEN AND PELVIS WITH CONTRAST  TECHNIQUE: Multidetector CT imaging of the abdomen and pelvis was performed using the standard protocol following bolus administration of intravenous contrast.  CONTRAST:  145mL OMNIPAQUE IOHEXOL 300 MG/ML  SOLN  COMPARISON:  None.  FINDINGS: Lower chest: Clear lung bases. No significant pleural or pericardial effusion.  Hepatobiliary: There is prominent segmental intrahepatic biliary dilatation anteriorly in the right hepatic lobe. There is heterogeneous enhancement within segments 4B and 5, measuring 5.1 x 3.6 cm on image 24, likely altered perfusion from the biliary dilatation as the dilated bile ducts pass through this lesion. A peripheral hypervascular mass is considered less likely. There is suspicion of a central liver mass causing biliary obstruction, potentially intraductal. This is most obvious on sagittal images 44 through 52. No other focal hepatic lesions  identified. The gallbladder appears normal without gallstones or wall thickening. The extrahepatic biliary system is nondilated.  Pancreas: Unremarkable. No pancreatic ductal dilatation or surrounding inflammatory changes.  Spleen: Normal in size without focal abnormality.  Adrenals/Urinary Tract: Both adrenal glands appear normal.Both kidneys demonstrate parapelvic cysts. There is no evidence of renal mass, hydronephrosis or urinary tract calculus. The bladder appears unremarkable.  Stomach/Bowel: No evidence of bowel wall thickening, distention or surrounding inflammatory change.There is a possible small appendiceal stump suggesting previous appendectomy. No pericecal inflammatory changes identified.  Vascular/Lymphatic: Several small lymph nodes within the porta hepatis are not pathologically enlarged and are nonspecific. There is no retroperitoneal or pelvic lymphadenopathy. There is moderate atherosclerosis of the aorta, its branches and the iliac arteries.  Reproductive: Status post hysterectomy. No evidence of adnexal mass.  Other: No evidence of abdominal wall mass or hernia.  Musculoskeletal: No acute or significant osseous findings. Mild lumbar spondylosis noted.  IMPRESSION: 1. Segmental intrahepatic biliary dilatation highly suspicious for central cholangiocarcinoma. Focal hypervascular lesion within segments 4B and 5 is likely due to altered perfusion, although potentially a peripheral mass lesion. Further evaluation with MRCP without and with contrast recommended. 2. No distant metastases identified. There are not specific small lymph nodes within the porta hepatis. 3. No acute inflammatory changes identified. Suspected previous appendectomy at time of hysterectomy. 4. Moderate atherosclerosis.   Electronically Signed   By: Richardean Sale M.D.   On: 09/23/2014 09:23    EKG: Independently reviewed.   Assessment/Plan Active Problems:   Cholangiocarcinoma   Abdominal pain   Nausea & vomiting    HTN (hypertension)   1. Abdominal pain. Patient presenting with complaints of abdominal pain for the past 5 days located in the right upper quadrant and epigastric regions characterized as constant, not alleviated with oral narcotic analgesia. She was seen at [facility] yesterday where a CT scan revealed findings worrisome for cholangiocarcinoma. Radiology reported no distant metastasis. Will likely need an ERCP for tissue diagnosis. Emergency room provider discussed case with Dr Michail Sermon of GI, who will evaluate patient this evening and make recommendations. Will provide supportive care, IV fluids, IV dilaudid for severe breakthrough pain.  2. Abnormal liver function tests. Lab work revealed elevated transaminases with AST and ALT of 159 and 141 respectively, alkaline phosphatase 440, along with total bilirubin of 4.1 which would explain her pruritus. Further workup with a CT scan of abdomen and pelvis yesterday showing findings worrisome for cholangiocarcinoma. Patient likely to undergo further workup with ERCP. 3.  Hypertension. Patient having elevated blood pressures in the emergency department which likely resulted from abdominal pain. Providing as needed IV narcotic analgesia, for now will continue verapamil 180 mg by mouth daily.  4. DVT prophylaxis. SCDs  5. Nutrition. Will place patient on clears   Code Status: Full code Family Communication: I spoke with her daughter was present at bedside Disposition Plan: Anticipate patient may require greater than 2 nights hospitalization  Time spent: 41 min  Kelvin Cellar [facility] Pager [phone]

## 2014-09-24 NOTE — ED Notes (Signed)
Pt placed in gown and 5 lead placed on pt.  EKG from triage at bedside.

## 2014-09-24 NOTE — ED Provider Notes (Signed)
CSN: [id number]     Arrival date & time 09/24/14  1545 History   First MD Initiated Contact with Patient 09/24/14 1553     Chief Complaint  Patient presents with  . Abdominal Pain  . Nausea     (Consider location/radiation/quality/duration/timing/severity/associated sxs/prior Treatment) HPI Comments: Patient presents to the emergency department with chief complaint of abdominal pain. She was seen yesterday and was diagnosed with a possible cholangiocarcinoma. Patient was told that she would need to undergo ERCP or MRCP, but has not been able to do so. She was discharged to home with Vicodin, but states that this has not been controlling her pain. She reports 8 out of 10 pain that has been persistent for the past 2 days. She reports associated nausea, but denies any vomiting. She describes the pain as sharp and sometimes stabbing. There are no aggravating or alleviating factors. Her gastroenterologist is with [facility] gastroenterology, but she would like a referral to a new gastroenterologist.   The history is provided by the patient. No language interpreter was used.    Past Medical History  Diagnosis Date  . UTI (urinary tract infection)   . Hypertension    Past Surgical History  Procedure Laterality Date  . Abdominal hysterectomy    . Joint replacement     History reviewed. No pertinent family history. History  Substance Use Topics  . Smoking status: Never Smoker   . Smokeless tobacco: Not on file  . Alcohol Use: Not on file   OB History    No data available     Review of Systems  Constitutional: Negative for fever and chills.  Respiratory: Negative for shortness of breath.   Cardiovascular: Negative for chest pain.  Gastrointestinal: Positive for abdominal pain. Negative for nausea, vomiting, diarrhea, constipation and abdominal distention.  Genitourinary: Negative for dysuria.  All other systems reviewed and are negative.     Allergies  Hydrocodone bitartrate  er  Home Medications   Prior to Admission medications   Medication Sig Start Date End Date Taking? Authorizing Provider  HYDROcodone-acetaminophen (NORCO/VICODIN) 5-325 MG per tablet Take 1-2 tablets by mouth every 6 (six) hours as needed for moderate pain. 09/23/14   Fredia Sorrow, MD  ondansetron (ZOFRAN ODT) 4 MG disintegrating tablet Take 1 tablet (4 mg total) by mouth every 8 (eight) hours as needed for nausea or vomiting. 09/23/14   Fredia Sorrow, MD   BP 173/58 mmHg  Pulse 61  Temp(Src) 98.2 F (36.8 C) (Oral)  Resp 24  Wt 183 lb (83.008 kg)  SpO2 99% Physical Exam  Constitutional: She is oriented to person, place, and time. She appears well-developed and well-nourished.  HENT:  Head: Normocephalic and atraumatic.  Eyes: Conjunctivae and EOM are normal. Pupils are equal, round, and reactive to light.  Neck: Normal range of motion. Neck supple.  Cardiovascular: Normal rate and regular rhythm.  Exam reveals no gallop and no friction rub.   No murmur heard. Pulmonary/Chest: Effort normal and breath sounds normal. No respiratory distress. She has no wheezes. She has no rales. She exhibits no tenderness.  Abdominal: Soft. Bowel sounds are normal. She exhibits no distension and no mass. There is tenderness. There is no rebound and no guarding.  Upper abdomen moderately ttp, no other focal abdominal tenderness  Musculoskeletal: Normal range of motion. She exhibits no edema or tenderness.  Neurological: She is alert and oriented to person, place, and time.  Skin: Skin is warm and dry.  Psychiatric: She has a normal  mood and affect. Her behavior is normal. Judgment and thought content normal.  Nursing note and vitals reviewed.   ED Course  Procedures (including critical care time) Results for orders placed or performed during the hospital encounter of 09/24/14  CBC with Differential  Result Value Ref Range   WBC 9.9 4.0 - 10.5 K/uL   RBC 3.85 (L) 3.87 - 5.11 MIL/uL   Hemoglobin  13.1 12.0 - 15.0 g/dL   HCT 39.0 36.0 - 46.0 %   MCV 101.3 (H) 78.0 - 100.0 fL   MCH 34.0 26.0 - 34.0 pg   MCHC 33.6 30.0 - 36.0 g/dL   RDW 13.0 11.5 - 15.5 %   Platelets 217 150 - 400 K/uL   Neutrophils Relative % 76 43 - 77 %   Neutro Abs 7.5 1.7 - 7.7 K/uL   Lymphocytes Relative 11 (L) 12 - 46 %   Lymphs Abs 1.1 0.7 - 4.0 K/uL   Monocytes Relative 13 (H) 3 - 12 %   Monocytes Absolute 1.3 (H) 0.1 - 1.0 K/uL   Eosinophils Relative 0 0 - 5 %   Eosinophils Absolute 0.0 0.0 - 0.7 K/uL   Basophils Relative 0 0 - 1 %   Basophils Absolute 0.0 0.0 - 0.1 K/uL  Comprehensive metabolic panel  Result Value Ref Range   Sodium 133 (L) 135 - 145 mmol/L   Potassium 4.0 3.5 - 5.1 mmol/L   Chloride 95 (L) 101 - 111 mmol/L   CO2 28 22 - 32 mmol/L   Glucose, Bld 115 (H) 65 - 99 mg/dL   BUN 9 6 - 20 mg/dL   Creatinine, Ser 0.78 0.44 - 1.00 mg/dL   Calcium 9.1 8.9 - 10.3 mg/dL   Total Protein 6.6 6.5 - 8.1 g/dL   Albumin 3.0 (L) 3.5 - 5.0 g/dL   AST 159 (H) 15 - 41 U/L   ALT 141 (H) 14 - 54 U/L   Alkaline Phosphatase 440 (H) 38 - 126 U/L   Total Bilirubin 4.1 (H) 0.3 - 1.2 mg/dL   GFR calc non Af Amer >60 >60 mL/min   GFR calc Af Amer >60 >60 mL/min   Anion gap 10 5 - 15  Lipase, blood  Result Value Ref Range   Lipase 16 (L) 22 - 51 U/L   Ct Abdomen Pelvis W Contrast  09/23/2014   CLINICAL DATA:  Right lower quadrant abdominal pain with nausea since last night. Initial encounter.  EXAM: CT ABDOMEN AND PELVIS WITH CONTRAST  TECHNIQUE: Multidetector CT imaging of the abdomen and pelvis was performed using the standard protocol following bolus administration of intravenous contrast.  CONTRAST:  179mL OMNIPAQUE IOHEXOL 300 MG/ML  SOLN  COMPARISON:  None.  FINDINGS: Lower chest: Clear lung bases. No significant pleural or pericardial effusion.  Hepatobiliary: There is prominent segmental intrahepatic biliary dilatation anteriorly in the right hepatic lobe. There is heterogeneous enhancement within  segments 4B and 5, measuring 5.1 x 3.6 cm on image 24, likely altered perfusion from the biliary dilatation as the dilated bile ducts pass through this lesion. A peripheral hypervascular mass is considered less likely. There is suspicion of a central liver mass causing biliary obstruction, potentially intraductal. This is most obvious on sagittal images 44 through 52. No other focal hepatic lesions identified. The gallbladder appears normal without gallstones or wall thickening. The extrahepatic biliary system is nondilated.  Pancreas: Unremarkable. No pancreatic ductal dilatation or surrounding inflammatory changes.  Spleen: Normal in size without focal abnormality.  Adrenals/Urinary Tract: Both adrenal glands appear normal.Both kidneys demonstrate parapelvic cysts. There is no evidence of renal mass, hydronephrosis or urinary tract calculus. The bladder appears unremarkable.  Stomach/Bowel: No evidence of bowel wall thickening, distention or surrounding inflammatory change.There is a possible small appendiceal stump suggesting previous appendectomy. No pericecal inflammatory changes identified.  Vascular/Lymphatic: Several small lymph nodes within the porta hepatis are not pathologically enlarged and are nonspecific. There is no retroperitoneal or pelvic lymphadenopathy. There is moderate atherosclerosis of the aorta, its branches and the iliac arteries.  Reproductive: Status post hysterectomy. No evidence of adnexal mass.  Other: No evidence of abdominal wall mass or hernia.  Musculoskeletal: No acute or significant osseous findings. Mild lumbar spondylosis noted.  IMPRESSION: 1. Segmental intrahepatic biliary dilatation highly suspicious for central cholangiocarcinoma. Focal hypervascular lesion within segments 4B and 5 is likely due to altered perfusion, although potentially a peripheral mass lesion. Further evaluation with MRCP without and with contrast recommended. 2. No distant metastases identified. There  are not specific small lymph nodes within the porta hepatis. 3. No acute inflammatory changes identified. Suspected previous appendectomy at time of hysterectomy. 4. Moderate atherosclerosis.   Electronically Signed   By: Richardean Sale M.D.   On: 09/23/2014 09:23     Imaging Review Ct Abdomen Pelvis W Contrast  09/23/2014   CLINICAL DATA:  Right lower quadrant abdominal pain with nausea since last night. Initial encounter.  EXAM: CT ABDOMEN AND PELVIS WITH CONTRAST  TECHNIQUE: Multidetector CT imaging of the abdomen and pelvis was performed using the standard protocol following bolus administration of intravenous contrast.  CONTRAST:  119mL OMNIPAQUE IOHEXOL 300 MG/ML  SOLN  COMPARISON:  None.  FINDINGS: Lower chest: Clear lung bases. No significant pleural or pericardial effusion.  Hepatobiliary: There is prominent segmental intrahepatic biliary dilatation anteriorly in the right hepatic lobe. There is heterogeneous enhancement within segments 4B and 5, measuring 5.1 x 3.6 cm on image 24, likely altered perfusion from the biliary dilatation as the dilated bile ducts pass through this lesion. A peripheral hypervascular mass is considered less likely. There is suspicion of a central liver mass causing biliary obstruction, potentially intraductal. This is most obvious on sagittal images 44 through 52. No other focal hepatic lesions identified. The gallbladder appears normal without gallstones or wall thickening. The extrahepatic biliary system is nondilated.  Pancreas: Unremarkable. No pancreatic ductal dilatation or surrounding inflammatory changes.  Spleen: Normal in size without focal abnormality.  Adrenals/Urinary Tract: Both adrenal glands appear normal.Both kidneys demonstrate parapelvic cysts. There is no evidence of renal mass, hydronephrosis or urinary tract calculus. The bladder appears unremarkable.  Stomach/Bowel: No evidence of bowel wall thickening, distention or surrounding inflammatory  change.There is a possible small appendiceal stump suggesting previous appendectomy. No pericecal inflammatory changes identified.  Vascular/Lymphatic: Several small lymph nodes within the porta hepatis are not pathologically enlarged and are nonspecific. There is no retroperitoneal or pelvic lymphadenopathy. There is moderate atherosclerosis of the aorta, its branches and the iliac arteries.  Reproductive: Status post hysterectomy. No evidence of adnexal mass.  Other: No evidence of abdominal wall mass or hernia.  Musculoskeletal: No acute or significant osseous findings. Mild lumbar spondylosis noted.  IMPRESSION: 1. Segmental intrahepatic biliary dilatation highly suspicious for central cholangiocarcinoma. Focal hypervascular lesion within segments 4B and 5 is likely due to altered perfusion, although potentially a peripheral mass lesion. Further evaluation with MRCP without and with contrast recommended. 2. No distant metastases identified. There are not specific small lymph nodes within the porta  hepatis. 3. No acute inflammatory changes identified. Suspected previous appendectomy at time of hysterectomy. 4. Moderate atherosclerosis.   Electronically Signed   By: Richardean Sale M.D.   On: 09/23/2014 09:23     EKG Interpretation None      MDM   Final diagnoses:  Pain of upper abdomen    Patient with abdominal pain, seen yesterday and diagnosed with possible cholangiocarcinoma. Patient reports for worsening pain. She has not had any additional imaging studies. She would like a new gastroenterologist, and would prefer to "tie into a [place] network." Patient seen by and discussed with Dr. Darl Householder. Patient will likely need to be admitted for pain control and possible ERCP versus MRCP and possible oncology consultation.  5:20 PM Patient discussed with Dr. Michail Sermon from GI, who will consult.  Recommends no additional imaging until seen by GI.  Plan for admit to medicine.  Appreciate Dr. Coralyn Pear of  [facility] for admitting the patient.    Montine Circle, PA-C 09/24/14 Glen Osborne Yao, MD 09/25/14 [phone]

## 2014-09-25 ENCOUNTER — Inpatient Hospital Stay (HOSPITAL_COMMUNITY): Payer: Medicare Other

## 2014-09-25 DIAGNOSIS — R945 Abnormal results of liver function studies: Secondary | ICD-10-CM

## 2014-09-25 DIAGNOSIS — R1011 Right upper quadrant pain: Secondary | ICD-10-CM

## 2014-09-25 DIAGNOSIS — R7989 Other specified abnormal findings of blood chemistry: Secondary | ICD-10-CM | POA: Insufficient documentation

## 2014-09-25 LAB — CBC
HCT: 36.7 % (ref 36.0–46.0)
HEMOGLOBIN: 12.1 g/dL (ref 12.0–15.0)
MCH: 33.7 pg (ref 26.0–34.0)
MCHC: 33 g/dL (ref 30.0–36.0)
MCV: 102.2 fL — AB (ref 78.0–100.0)
PLATELETS: 195 10*3/uL (ref 150–400)
RBC: 3.59 MIL/uL — AB (ref 3.87–5.11)
RDW: 13.1 % (ref 11.5–15.5)
WBC: 8.9 10*3/uL (ref 4.0–10.5)

## 2014-09-25 LAB — COMPREHENSIVE METABOLIC PANEL
ALK PHOS: 349 U/L — AB (ref 38–126)
ALT: 115 U/L — ABNORMAL HIGH (ref 14–54)
ANION GAP: 10 (ref 5–15)
AST: 125 U/L — ABNORMAL HIGH (ref 15–41)
Albumin: 2.7 g/dL — ABNORMAL LOW (ref 3.5–5.0)
BUN: 9 mg/dL (ref 6–20)
CALCIUM: 8.9 mg/dL (ref 8.9–10.3)
CO2: 28 mmol/L (ref 22–32)
Chloride: 98 mmol/L — ABNORMAL LOW (ref 101–111)
Creatinine, Ser: 0.72 mg/dL (ref 0.44–1.00)
Glucose, Bld: 96 mg/dL (ref 65–99)
Potassium: 3.8 mmol/L (ref 3.5–5.1)
SODIUM: 136 mmol/L (ref 135–145)
TOTAL PROTEIN: 6.1 g/dL — AB (ref 6.5–8.1)
Total Bilirubin: 3.4 mg/dL — ABNORMAL HIGH (ref 0.3–1.2)

## 2014-09-25 MED ORDER — PANTOPRAZOLE SODIUM 40 MG PO TBEC
40.0000 mg | DELAYED_RELEASE_TABLET | Freq: Every day | ORAL | Status: DC
  Administered 2014-09-25 – 2014-09-28 (×4): 40 mg via ORAL
  Filled 2014-09-25 (×4): qty 1

## 2014-09-25 MED ORDER — GADOBENATE DIMEGLUMINE 529 MG/ML IV SOLN: 15.0000 mL | Freq: Once | INTRAVENOUS | Status: AC | PRN

## 2014-09-25 MED ORDER — TRAMADOL HCL 50 MG PO TABS
50.0000 mg | ORAL_TABLET | Freq: Four times a day (QID) | ORAL | Status: DC | PRN
  Administered 2014-09-25: 50 mg via ORAL
  Filled 2014-09-25: qty 1

## 2014-09-25 NOTE — Progress Notes (Signed)
[facility] PROGRESS NOTE  Krista Bentley DTO:[id number] DOB: May 08, 1933 DOA: 09/24/2014 PCP: Idamae Schuller, MD  Assessment/Plan: 1-Obstructive Jaundice:  Transaminases.  CT abdomen : with intrahepatic biliary dilation, possible peripheral mass liver. Concern for cholangiocarcinoma.  MRCP ordered.  Dr Michail Sermon consulted and helping with management,  bilirubin slightly decrease 4---3. Start protonix.   2-HNT; continue with verapamil.  3-DVT prophylaxis. SCD; if no plan for ERCP for tomorrow patient might need Lovenox for DVT prophylaxis.   Code Status: Full Code.  Family Communication: care discussed with patient.  Disposition Plan: awaiting GI evaluation    Consultants:  Dr Michail Sermon.   Procedures:  MRCP  Antibiotics:  none  HPI/Subjective She is having RUQ abdominal pain. Vicodin upset her stomach. IV pain medication is helping. She just came from MRCP. Explain to patient will need to await for MRCP to determine next step.   She has tried tramadol for pain in the past.    Objective: Filed Vitals:   09/25/14 0550  BP: 135/42  Pulse: 65  Temp: 98.4 F (36.9 C)  Resp: 16    Intake/Output Summary (Last 24 hours) at 09/25/14 0744 Last data filed at 09/25/14 0300  Gross per 24 hour  Intake      0 ml  Output    200 ml  Net   -200 ml   Filed Weights   09/24/14 1552 09/25/14 0700  Weight: 83.008 kg (183 lb) 83 kg (182 lb 15.7 oz)    Exam:   General:  Alert in no distress.   Cardiovascular: S 1, S 2 RRR  Respiratory: CTA  Abdomen: BS present, soft, RUQ tenderness.   Musculoskeletal: no edema  Data Reviewed: Basic Metabolic Panel:  Recent Labs Lab 09/23/14 0750 09/24/14 1612 09/25/14 0348  NA 135 133* 136  K 4.1 4.0 3.8  CL 99* 95* 98*  CO2 28 28 28   GLUCOSE 127* 115* 96  BUN 15 9 9   CREATININE 0.71 0.78 0.72  CALCIUM 8.9 9.1 8.9   Liver Function Tests:  Recent Labs Lab 09/23/14 0750 09/24/14 1612 09/25/14 0348  AST 152*  159* 125*  ALT 139* 141* 115*  ALKPHOS 448* 440* 349*  BILITOT 1.3* 4.1* 3.4*  PROT 6.8 6.6 6.1*  ALBUMIN 3.2* 3.0* 2.7*    Recent Labs Lab 09/23/14 0750 09/24/14 1612  LIPASE 11* 16*   No results for input(s): AMMONIA in the last 168 hours. CBC:  Recent Labs Lab 09/23/14 0750 09/24/14 1612 09/25/14 0348  WBC 7.3 9.9 8.9  NEUTROABS 5.8 7.5  --   HGB 12.2 13.1 12.1  HCT 37.5 39.0 36.7  MCV 102.7* 101.3* 102.2*  PLT 209 217 195   Cardiac Enzymes: No results for input(s): CKTOTAL, CKMB, CKMBINDEX, TROPONINI in the last 168 hours. BNP (last 3 results) No results for input(s): BNP in the last 8760 hours.  ProBNP (last 3 results) No results for input(s): PROBNP in the last 8760 hours.  CBG: No results for input(s): GLUCAP in the last 168 hours.  No results found for this or any previous visit (from the past 240 hour(s)).   Studies: Ct Abdomen Pelvis W Contrast  09/23/2014   CLINICAL DATA:  Right lower quadrant abdominal pain with nausea since last night. Initial encounter.  EXAM: CT ABDOMEN AND PELVIS WITH CONTRAST  TECHNIQUE: Multidetector CT imaging of the abdomen and pelvis was performed using the standard protocol following bolus administration of intravenous contrast.  CONTRAST:  123mL OMNIPAQUE IOHEXOL 300 MG/ML  SOLN  COMPARISON:  None.  FINDINGS: Lower chest: Clear lung bases. No significant pleural or pericardial effusion.  Hepatobiliary: There is prominent segmental intrahepatic biliary dilatation anteriorly in the right hepatic lobe. There is heterogeneous enhancement within segments 4B and 5, measuring 5.1 x 3.6 cm on image 24, likely altered perfusion from the biliary dilatation as the dilated bile ducts pass through this lesion. A peripheral hypervascular mass is considered less likely. There is suspicion of a central liver mass causing biliary obstruction, potentially intraductal. This is most obvious on sagittal images 44 through 52. No other focal hepatic lesions  identified. The gallbladder appears normal without gallstones or wall thickening. The extrahepatic biliary system is nondilated.  Pancreas: Unremarkable. No pancreatic ductal dilatation or surrounding inflammatory changes.  Spleen: Normal in size without focal abnormality.  Adrenals/Urinary Tract: Both adrenal glands appear normal.Both kidneys demonstrate parapelvic cysts. There is no evidence of renal mass, hydronephrosis or urinary tract calculus. The bladder appears unremarkable.  Stomach/Bowel: No evidence of bowel wall thickening, distention or surrounding inflammatory change.There is a possible small appendiceal stump suggesting previous appendectomy. No pericecal inflammatory changes identified.  Vascular/Lymphatic: Several small lymph nodes within the porta hepatis are not pathologically enlarged and are nonspecific. There is no retroperitoneal or pelvic lymphadenopathy. There is moderate atherosclerosis of the aorta, its branches and the iliac arteries.  Reproductive: Status post hysterectomy. No evidence of adnexal mass.  Other: No evidence of abdominal wall mass or hernia.  Musculoskeletal: No acute or significant osseous findings. Mild lumbar spondylosis noted.  IMPRESSION: 1. Segmental intrahepatic biliary dilatation highly suspicious for central cholangiocarcinoma. Focal hypervascular lesion within segments 4B and 5 is likely due to altered perfusion, although potentially a peripheral mass lesion. Further evaluation with MRCP without and with contrast recommended. 2. No distant metastases identified. There are not specific small lymph nodes within the porta hepatis. 3. No acute inflammatory changes identified. Suspected previous appendectomy at time of hysterectomy. 4. Moderate atherosclerosis.   Electronically Signed   By: Richardean Sale M.D.   On: 09/23/2014 09:23    Scheduled Meds: . verapamil  180 mg Oral QHS   Continuous Infusions: . sodium chloride 75 mL/hr at 09/24/14 2034    Active  Problems:   Cholangiocarcinoma   Abdominal pain   Nausea & vomiting   HTN (hypertension)    Time spent: 35 minutes.     Krista Bentley A  [facility] Pager [phone]. If 7PM-7AM, please contact night-coverage at [email or web address], password [facility] 09/25/2014, 7:44 AM  LOS: 1 day

## 2014-09-25 NOTE — Progress Notes (Signed)
Patient ID: Krista Bentley, female   DOB: 1933/08/19, 81 y.o.   MRN: [id number] [facility] Gastroenterology Progress Note  Krista Bentley 81 y.o. 05/21/1933   Subjective: Less abdominal pain today. Feels a little better. Tolerating clear liquids. No nausea or vomiting. Husband and daughter at bedside.  Objective: Vital signs in last 24 hours: Filed Vitals:   09/25/14 1359  BP: 159/73  Pulse: 61  Temp: 99.2 F (37.3 C)  Resp: 18    Physical Exam: Gen: elderly, alert, no acute distress, pleasant CV: RRR Chest: CTA B Abd: less tender in epigastric and RUQ with minimal guarding, minimal tenderness in LLQ with guarding, soft, nondistended, +BS Ext: no edema  Lab Results:  Recent Labs  09/24/14 1612 09/25/14 0348  NA 133* 136  K 4.0 3.8  CL 95* 98*  CO2 28 28  GLUCOSE 115* 96  BUN 9 9  CREATININE 0.78 0.72  CALCIUM 9.1 8.9    Recent Labs  09/24/14 1612 09/25/14 0348  AST 159* 125*  ALT 141* 115*  ALKPHOS 440* 349*  BILITOT 4.1* 3.4*  PROT 6.6 6.1*  ALBUMIN 3.0* 2.7*    Recent Labs  09/23/14 0750 09/24/14 1612 09/25/14 0348  WBC 7.3 9.9 8.9  NEUTROABS 5.8 7.5  --   HGB 12.2 13.1 12.1  HCT 37.5 39.0 36.7  MCV 102.7* 101.3* 102.2*  PLT 209 217 195   No results for input(s): LABPROT, INR in the last 72 hours.    Assessment/Plan: Obstructive jaundice with MRCP today also showing a probable cholangiocarcinoma. Her LFTs are improving and clinically she is somewhat better. She will need a biliary stent placed and brushing of her bile ducts with an ERCP prior to discharge. Will tentatively plan to do the ERCP this coming Monday. Ok to change to full liquid diet but would not advance beyond that at this time.   Sheridan C. 09/25/2014, 5:01 PM  Pager [phone]  If no answer or after 5 PM call [phone]

## 2014-09-26 LAB — URINE MICROSCOPIC-ADD ON

## 2014-09-26 LAB — URINALYSIS, ROUTINE W REFLEX MICROSCOPIC
BILIRUBIN URINE: NEGATIVE
Glucose, UA: NEGATIVE mg/dL
KETONES UR: NEGATIVE mg/dL
NITRITE: NEGATIVE
Protein, ur: NEGATIVE mg/dL
Specific Gravity, Urine: 1.005 (ref 1.005–1.030)
UROBILINOGEN UA: 4 mg/dL — AB (ref 0.0–1.0)
pH: 6 (ref 5.0–8.0)

## 2014-09-26 MED ORDER — CEFTRIAXONE SODIUM IN DEXTROSE 20 MG/ML IV SOLN
1.0000 g | INTRAVENOUS | Status: DC
  Administered 2014-09-26 – 2014-09-27 (×2): 1 g via INTRAVENOUS
  Filled 2014-09-26 (×3): qty 50

## 2014-09-26 MED ORDER — POLYETHYLENE GLYCOL 3350 17 G PO PACK
17.0000 g | PACK | Freq: Two times a day (BID) | ORAL | Status: DC
  Administered 2014-09-26 – 2014-09-27 (×3): 17 g via ORAL
  Filled 2014-09-26 (×5): qty 1

## 2014-09-26 NOTE — Progress Notes (Signed)
[facility] PROGRESS NOTE  Krista Bentley ZOX:[id number] DOB: [birth year] DOA: 09/24/2014 PCP: Idamae Schuller, MD  Assessment/Plan: 1-Obstructive Jaundice:  Transaminases.  CT abdomen : with intrahepatic biliary dilation, possible peripheral mass liver. Concern for cholangiocarcinoma.  MRCP: there is persistent concern of malignant biliary obstruction from probable cholangiocarcinoma. This could be secondary to a peripheral tumor with central adenopathy or a central lesion with peripheral metastasis. Dr Michail Sermon consulted and helping with management,  bilirubin slightly decrease 4---3. Started protonix.  Plan for ERCP 6-13  2-HNT; continue with verapamil.   3-DVT prophylaxis. SCD;  4-Dysuria: check UA and urine culture.    Code Status: Full Code.  Family Communication: care discussed with patient.  Disposition Plan: awaiting GI evaluation    Consultants:  Dr Michail Sermon.   Procedures:  MRCP  Antibiotics:  none  HPI/Subjective Abdominal pain better. Relates dysuria.    Objective: Filed Vitals:   09/26/14 0625  BP: 132/55  Pulse: 54  Temp: 98.4 F (36.9 C)  Resp: 18    Intake/Output Summary (Last 24 hours) at 09/26/14 1030 Last data filed at 09/26/14 0913  Gross per 24 hour  Intake   1020 ml  Output      0 ml  Net   1020 ml   Filed Weights   09/24/14 1552 September 27, 2014 0700  Weight: 83.008 kg (183 lb) 83 kg (182 lb 15.7 oz)    Exam:   General:  Alert in no distress.   Cardiovascular: S 1, S 2 RRR  Respiratory: CTA  Abdomen: BS present, soft, RUQ tenderness.   Musculoskeletal: no edema  Data Reviewed: Basic Metabolic Panel:  Recent Labs Lab 09/23/14 0750 09/24/14 1612 09/27/2014 0348  NA 135 133* 136  K 4.1 4.0 3.8  CL 99* 95* 98*  CO2 28 28 28   GLUCOSE 127* 115* 96  BUN 15 9 9   CREATININE 0.71 0.78 0.72  CALCIUM 8.9 9.1 8.9   Liver Function Tests:  Recent Labs Lab 09/23/14 0750 09/24/14 1612 09-27-2014 0348  AST 152* 159*  125*  ALT 139* 141* 115*  ALKPHOS 448* 440* 349*  BILITOT 1.3* 4.1* 3.4*  PROT 6.8 6.6 6.1*  ALBUMIN 3.2* 3.0* 2.7*    Recent Labs Lab 09/23/14 0750 09/24/14 1612  LIPASE 11* 16*   No results for input(s): AMMONIA in the last 168 hours. CBC:  Recent Labs Lab 09/23/14 0750 09/24/14 1612 Sep 27, 2014 0348  WBC 7.3 9.9 8.9  NEUTROABS 5.8 7.5  --   HGB 12.2 13.1 12.1  HCT 37.5 39.0 36.7  MCV 102.7* 101.3* 102.2*  PLT 209 217 195   Cardiac Enzymes: No results for input(s): CKTOTAL, CKMB, CKMBINDEX, TROPONINI in the last 168 hours. BNP (last 3 results) No results for input(s): BNP in the last 8760 hours.  ProBNP (last 3 results) No results for input(s): PROBNP in the last 8760 hours.  CBG: No results for input(s): GLUCAP in the last 168 hours.  No results found for this or any previous visit (from the past 240 hour(s)).   Studies: Mr 3d Recon At Scanner  27-Sep-2014   CLINICAL DATA:  81 year old with decreased appetite, weight loss and pruritis with elevated liver function studies. Biliary dilatation and concern of cholangiocarcinoma on CT.  EXAM: MRI ABDOMEN WITHOUT AND WITH CONTRAST (INCLUDING MRCP)  TECHNIQUE: Multiplanar multisequence MR imaging of the abdomen was performed both before and after the administration of intravenous contrast. Heavily T2-weighted images of the biliary and pancreatic ducts were obtained, and three-dimensional MRCP images were rendered by post  processing.  CONTRAST:  20 ml MultiHance.  COMPARISON:  Abdominal CT 09/23/2014. Previous studies have been merged ; an older study from 08/18/2013 is now available as well.  FINDINGS: Unfortunately, the study is moderately motion degraded. The patient was not able to suspend respiration.  Lower chest:  Unremarkable.  Hepatobiliary: Again demonstrated is prominent segmental intrahepatic biliary dilatation anteriorly in the right hepatic lobe. The gallbladder is present without definite stones or wall thickening.  As demonstrated on recent CT, there is persistent concern of a possible intra biliary lesion, measuring 1.6 cm on image 21 of series 3. This could reflect a lymph node in the porta hepatis causing extrinsic compression of the biliary system. On MRI, the peripheral area of altered enhancement in segments 4B and 5, adjacent to the gallbladder appears more mass-like, measuring 4.8 x 3.5 cm on image 22. This does demonstrate enhancement following contrast. No other liver lesions identified. The extrahepatic biliary system appears normal in caliber.  Pancreas: Unremarkable. No pancreatic ductal dilatation or surrounding inflammatory changes.  Spleen: Normal in size without focal abnormality.  Adrenals/Urinary Tract: Both adrenal glands appear normal.Bilateral renal parapelvic cysts. No hydronephrosis.  Stomach/Bowel: No evidence of bowel wall thickening, distention or surrounding inflammatory change.  Vascular/Lymphatic: Stable mildly prominent lymph nodes within the porta hepatis. Atherosclerosis better demonstrated by CT.  Other: None.  Musculoskeletal: Heterogeneous marrow signal throughout the spine and sacrum without typical metastatic disease.  IMPRESSION: 1. The current examination does not better clarify the previously demonstrated findings on CT due to motion. However, there is persistent concern of malignant biliary obstruction from probable cholangiocarcinoma. This could be secondary to a peripheral tumor with central adenopathy or a central lesion with peripheral metastasis. 2. Tissue sampling recommended. 3. No distant metastases identified.   Electronically Signed   By: Richardean Sale M.D.   On: 09/25/2014 15:05   Mr Abd W/wo Cm/mrcp  09/25/2014   CLINICAL DATA:  81 year old with decreased appetite, weight loss and pruritis with elevated liver function studies. Biliary dilatation and concern of cholangiocarcinoma on CT.  EXAM: MRI ABDOMEN WITHOUT AND WITH CONTRAST (INCLUDING MRCP)  TECHNIQUE:  Multiplanar multisequence MR imaging of the abdomen was performed both before and after the administration of intravenous contrast. Heavily T2-weighted images of the biliary and pancreatic ducts were obtained, and three-dimensional MRCP images were rendered by post processing.  CONTRAST:  20 ml MultiHance.  COMPARISON:  Abdominal CT 09/23/2014. Previous studies have been merged ; an older study from 08/18/2013 is now available as well.  FINDINGS: Unfortunately, the study is moderately motion degraded. The patient was not able to suspend respiration.  Lower chest:  Unremarkable.  Hepatobiliary: Again demonstrated is prominent segmental intrahepatic biliary dilatation anteriorly in the right hepatic lobe. The gallbladder is present without definite stones or wall thickening. As demonstrated on recent CT, there is persistent concern of a possible intra biliary lesion, measuring 1.6 cm on image 21 of series 3. This could reflect a lymph node in the porta hepatis causing extrinsic compression of the biliary system. On MRI, the peripheral area of altered enhancement in segments 4B and 5, adjacent to the gallbladder appears more mass-like, measuring 4.8 x 3.5 cm on image 22. This does demonstrate enhancement following contrast. No other liver lesions identified. The extrahepatic biliary system appears normal in caliber.  Pancreas: Unremarkable. No pancreatic ductal dilatation or surrounding inflammatory changes.  Spleen: Normal in size without focal abnormality.  Adrenals/Urinary Tract: Both adrenal glands appear normal.Bilateral renal parapelvic cysts. No hydronephrosis.  Stomach/Bowel: No evidence of bowel wall thickening, distention or surrounding inflammatory change.  Vascular/Lymphatic: Stable mildly prominent lymph nodes within the porta hepatis. Atherosclerosis better demonstrated by CT.  Other: None.  Musculoskeletal: Heterogeneous marrow signal throughout the spine and sacrum without typical metastatic disease.   IMPRESSION: 1. The current examination does not better clarify the previously demonstrated findings on CT due to motion. However, there is persistent concern of malignant biliary obstruction from probable cholangiocarcinoma. This could be secondary to a peripheral tumor with central adenopathy or a central lesion with peripheral metastasis. 2. Tissue sampling recommended. 3. No distant metastases identified.   Electronically Signed   By: Richardean Sale M.D.   On: 09/25/2014 15:05    Scheduled Meds: . pantoprazole  40 mg Oral Daily  . polyethylene glycol  17 g Oral BID  . verapamil  180 mg Oral QHS   Continuous Infusions: . sodium chloride 75 mL/hr at 09/25/14 2248    Active Problems:   Cholangiocarcinoma   Abdominal pain   Nausea & vomiting   HTN (hypertension)   Elevated LFTs    Time spent: 35 minutes.     [name] A  [facility] Pager [phone]. If 7PM-7AM, please contact night-coverage at [email or web address], password [facility] 09/26/2014, 10:30 AM  LOS: 2 days

## 2014-09-26 NOTE — Progress Notes (Signed)
Patient ID: Krista Bentley, female   DOB: [birth year], 81 y.o.   MRN: [id number] [facility] Gastroenterology Progress Note  Rodolfo Notaro 81 y.o. 1933/10/14   Subjective: Reports increase in RUQ and epigastric pain today. Denies N/V. Tolerating liquid diet. Nurse at bedside.  Objective: Vital signs in last 24 hours: Filed Vitals:   09/26/14 0625  BP: 132/55  Pulse: 54  Temp: 98.4 F (36.9 C)  Resp: 18    Physical Exam: Gen: alert, no acute distress, elderly, well-nourished CV: RRR Chest: CTA B Abd: minimal RUQ and right mid-quadrant tenderness with guarding, otherwise nontender, soft, nondistended, +BS Skin: no rash  Lab Results:  Recent Labs  09/24/14 1612 09/25/14 0348  NA 133* 136  K 4.0 3.8  CL 95* 98*  CO2 28 28  GLUCOSE 115* 96  BUN 9 9  CREATININE 0.78 0.72  CALCIUM 9.1 8.9    Recent Labs  09/24/14 1612 09/25/14 0348  AST 159* 125*  ALT 141* 115*  ALKPHOS 440* 349*  BILITOT 4.1* 3.4*  PROT 6.6 6.1*  ALBUMIN 3.0* 2.7*    Recent Labs  09/24/14 1612 09/25/14 0348  WBC 9.9 8.9  NEUTROABS 7.5  --   HGB 13.1 12.1  HCT 39.0 36.7  MCV 101.3* 102.2*  PLT 217 195   No results for input(s): LABPROT, INR in the last 72 hours.    Assessment/Plan: Obstructive jaundice with imaging concerning for a cholangiocarcinoma. Recheck LFTs. Tentatively scheduled for ERCP tomorrow morning for stent placement and brushing of bile duct to obtain tissue diagnosis. NPO p MN. Supportive care.   Brooks C. 09/26/2014, 1:18 PM  Pager [phone]  If no answer or after 5 PM call [phone]

## 2014-09-27 ENCOUNTER — Encounter (HOSPITAL_COMMUNITY): Payer: Self-pay | Admitting: Anesthesiology

## 2014-09-27 ENCOUNTER — Encounter (HOSPITAL_COMMUNITY)
Admission: EM | Disposition: A | Discharge status: Home Health | Payer: Self-pay | Source: Home / Self Care | Attending: Internal Medicine

## 2014-09-27 ENCOUNTER — Inpatient Hospital Stay (HOSPITAL_COMMUNITY): Payer: Medicare Other | Admitting: Anesthesiology

## 2014-09-27 HISTORY — PX: EUS: SHX5427

## 2014-09-27 LAB — COMPREHENSIVE METABOLIC PANEL
ALBUMIN: 2.2 g/dL — AB (ref 3.5–5.0)
ALT: 80 U/L — AB (ref 14–54)
AST: 90 U/L — ABNORMAL HIGH (ref 15–41)
Alkaline Phosphatase: 370 U/L — ABNORMAL HIGH (ref 38–126)
Anion gap: 7 (ref 5–15)
BUN: 6 mg/dL (ref 6–20)
CO2: 30 mmol/L (ref 22–32)
Calcium: 8.3 mg/dL — ABNORMAL LOW (ref 8.9–10.3)
Chloride: 100 mmol/L — ABNORMAL LOW (ref 101–111)
Creatinine, Ser: 0.69 mg/dL (ref 0.44–1.00)
GFR calc Af Amer: >60 mL/min (ref >60)
GFR calc non Af Amer: >60 mL/min (ref >60)
Glucose, Bld: 103 mg/dL — ABNORMAL HIGH (ref 65–99)
POTASSIUM: 3.6 mmol/L (ref 3.5–5.1)
SODIUM: 137 mmol/L (ref 135–145)
TOTAL PROTEIN: 5.5 g/dL — AB (ref 6.5–8.1)
Total Bilirubin: 1.7 mg/dL — ABNORMAL HIGH (ref 0.3–1.2)

## 2014-09-27 LAB — CBC
HCT: 33.2 % — ABNORMAL LOW (ref 36.0–46.0)
HEMOGLOBIN: 10.9 g/dL — AB (ref 12.0–15.0)
MCH: 33.7 pg (ref 26.0–34.0)
MCHC: 32.8 g/dL (ref 30.0–36.0)
MCV: 102.8 fL — ABNORMAL HIGH (ref 78.0–100.0)
PLATELETS: 202 10*3/uL (ref 150–400)
RBC: 3.23 MIL/uL — ABNORMAL LOW (ref 3.87–5.11)
RDW: 13.2 % (ref 11.5–15.5)
WBC: 5.9 10*3/uL (ref 4.0–10.5)

## 2014-09-27 SURGERY — ESOPHAGEAL ENDOSCOPIC ULTRASOUND (EUS) RADIAL
Anesthesia: Monitor Anesthesia Care

## 2014-09-27 MED ORDER — LACTATED RINGERS IV SOLN
Freq: Once | INTRAVENOUS | Status: AC
  Administered 2014-09-27: 1000 mL via INTRAVENOUS

## 2014-09-27 MED ORDER — LIDOCAINE HCL (CARDIAC) 20 MG/ML IV SOLN
INTRAVENOUS | Status: DC | PRN
  Administered 2014-09-27: 50 mg via INTRAVENOUS

## 2014-09-27 MED ORDER — SODIUM CHLORIDE 0.9 % IV SOLN: INTRAVENOUS | Status: DC

## 2014-09-27 MED ORDER — BUTAMBEN-TETRACAINE-BENZOCAINE 2-2-14 % EX AERO
INHALATION_SPRAY | CUTANEOUS | Status: DC | PRN
  Administered 2014-09-27: 2 via TOPICAL

## 2014-09-27 MED ORDER — SENNOSIDES-DOCUSATE SODIUM 8.6-50 MG PO TABS
1.0000 | ORAL_TABLET | Freq: Two times a day (BID) | ORAL | Status: DC
  Administered 2014-09-27 – 2014-09-28 (×2): 1 via ORAL
  Filled 2014-09-27 (×3): qty 1

## 2014-09-27 MED ORDER — PROPOFOL INFUSION 10 MG/ML OPTIME
INTRAVENOUS | Status: DC | PRN
  Administered 2014-09-27: 75 ug/kg/min via INTRAVENOUS

## 2014-09-27 NOTE — Op Note (Signed)
[facility] [place], [id number]   ENDOSCOPIC ULTRASOUND PROCEDURE REPORT  PATIENT: Krista, Bentley  MR#: [id number] BIRTHDATE: 29-Mar-1934  GENDER: female ENDOSCOPIST: Arta Silence, MD REFERRED BY:  [facility] PROCEDURE DATE:  09/27/2014 PROCEDURE:   Upper EUS ASA CLASS:      Class III INDICATIONS:   1.  abdominal pain, dilated right intrahepatic bile duct, elevated LFTs, abnormal CT/MRI abdomen. MEDICATIONS: Monitored anesthesia care and Cetacaine spray  DESCRIPTION OF PROCEDURE:   After the risks benefits and alternatives of the procedure were  explained, informed consent was obtained. The patient was then placed in the left, lateral, decubitus postion and IV sedation was administered. Throughout the procedure, the patients blood pressure, pulse and oxygen saturations were monitored continuously.  Under direct visualization, the [id number]  endoscope was introduced through the mouth  and advanced to the second portion of the duodenum .  Water was used as necessary to provide an acoustic interface. Estimated blood loss is zero unless otherwise noted in this procedure report. Upon completion of the imaging, water was removed and the patient was sent to the recovery room in satisfactory condition.    FINDINGS:      Pancreatic parenchyma was diffusely heterogeneous, but no focal mass was seen.  Ampulla and extrehepatic bile duct appeared non-dilated without wall thickness and were otherwise normal.  Able to tract the extrahepatic bile duct to the proximal common hepatic duct.  Gallbladder seen and appeared grossly normal and wasn't appreciably distended and had no obvious abnormality. Could appreciate diffuse right intrahepatic biliary dilatation but could not track this to the level of the suspected right intraductal obstructed region, which is not unexpected.  Couple small benign-appearing periportal lymph nodes  seen.  IMPRESSION:     As above.  Right intrahepatic biliary dilatation, suspected cholangiocarcinoma in the peripheral right main intrahepatic duct, but unable to be visualized via EUS.  No lesion visible for EUS-guided biopsy.  RECOMMENDATIONS:     1.  Watch for potential complications of procedure. 2.  Check Ca 19-9 and CEA levels. 3.  Surgical consultation, for evaluation of (A) potential surgical candidacy, (B) best means and need for obtaining biopsy (possible CT/US-guided liver biopsy), (C) evaluation for timing and need for percutaneous biliary drain. 4.  Unfortunately, not much else for [place] to do at this point; [facility] will follow at a distance.   _______________________________ eSigned:  Arta Silence, MD 09/27/2014 10:14 AM   CC:

## 2014-09-27 NOTE — Interval H&P Note (Signed)
History and Physical Interval Note:  09/27/2014 9:12 AM  Krista Bentley  has presented today for surgery, with the diagnosis of R/O ca  The various methods of treatment have been discussed with the patient and family. After consideration of risks, benefits and other options for treatment, the patient has consented to  Procedure(s): ESOPHAGEAL ENDOSCOPIC ULTRASOUND (EUS) RADIAL (N/A) as a surgical intervention .  The patient's history has been reviewed, patient examined, no change in status, stable for surgery.  I have reviewed the patient's chart and labs.  Questions were answered to the patient's satisfaction.     Krista Bentley  Assessment:  1.  Jaundice, elevated LFTs. 2.  Abnormal imaging study abdomen (CT, MRI):  Right intrahepatic biliary dilatation, possible right intraductal mass.  Concern for right-sided cholangiocarcinoma.  Plan:  1.  Endoscopic ultrasound with possible fine needle aspiration biopsies. 2.  Risks (bleeding, infection, bowel perforation that could require surgery, sedation-related changes in cardiopulmonary systems), benefits (identification and possible treatment of source of symptoms, exclusion of certain causes of symptoms), and alternatives (watchful waiting, radiographic imaging studies, empiric medical treatment) of upper endoscopy with ultrasound and possible biopsies (EUS +/- FNA) were explained to patient/family in detail and patient wishes to proceed. 3.  Lesion not amenable to ERCP; will likely ultimately need percutaneous biliary drain (PTC) for relief of biliary obstruction.

## 2014-09-27 NOTE — Anesthesia Preprocedure Evaluation (Addendum)
Anesthesia Evaluation  Patient identified by MRN, date of birth, ID band Patient awake    Reviewed: Allergy & Precautions, NPO status , Patient's Chart, lab work & pertinent test results  History of Anesthesia Complications Negative for: history of anesthetic complications  Airway Mallampati: II  TM Distance: >3 FB Neck ROM: Full    Dental  (+) Teeth Intact, Dental Advisory Given   Pulmonary  breath sounds clear to auscultation        Cardiovascular hypertension, Pt. on medications Rhythm:Regular Rate:Normal     Neuro/Psych negative neurological ROS  negative psych ROS   GI/Hepatic GERD-  Medicated and Controlled,Elevated LFTs   Endo/Other    Renal/GU negative Renal ROS     Musculoskeletal negative musculoskeletal ROS (+)   Abdominal   Peds  Hematology   Anesthesia Other Findings   Reproductive/Obstetrics negative OB ROS                           Anesthesia Physical Anesthesia Plan  ASA: III  Anesthesia Plan: MAC   Post-op Pain Management:    Induction: Intravenous  Airway Management Planned: Natural Airway  Additional Equipment:   Intra-op Plan:   Post-operative Plan:   Informed Consent: I have reviewed the patients History and Physical, chart, labs and discussed the procedure including the risks, benefits and alternatives for the proposed anesthesia with the patient or authorized representative who has indicated his/her understanding and acceptance.   Dental advisory given  Plan Discussed with: CRNA and Surgeon  Anesthesia Plan Comments:         Anesthesia Quick Evaluation

## 2014-09-27 NOTE — Consult Note (Signed)
Reason for Consult: possible cholangiocarcinoma  Referring Physician: Dr. Arta Silence    HPI: Krista Bentley is a 81 year old female presented to [facility] with abdominal pain.  She was seen at [facility] on the 9th and underwent a CT of A/P which showed intrahepatic biliary dilatation highly suspicious for central cholangiocarcinoma.  She was referred back to her primary care doctor and discharged home.  Due to persistent pain, she came for further evaluation to [facility]. For the past 2 months, the patient reports intermittent abdominal pain.  Typically would come on in the evening time.  Time pattern is constant.  Moderate in severity.  Location is RUQ with radiation to the back.  Modifying factors include; a heating pad.  No aggravating or alleviating factors.  She reports early satiety.  Reports 5-10lb weight loss over the last year.  She had a colonoscopy in the last 6 months which showed polyps, otherwise normal.  Reports nausea, but no vomiting, diarrhea or constipation.  Endorses dark urine and has been treated by urology for recurrent UTIs over the past year.   Her work up shows abnormal LFTs, with T bili up to 4.1 on admission, down to 1.7 today.  Alk phos remains elevated.  Normal white count. MRCP concerning for malignant biliary obstruction from probable cholangiocarcinoma.  EUS today showed right intrahepatic biliary dilatation, but unfortunately, an ERCP was unable to be performed.  We have been asked to evaluate for further recommendations.   Past Medical History  Diagnosis Date  . UTI (urinary tract infection)   . Hypertension     Past Surgical History  Procedure Laterality Date  . Abdominal hysterectomy    . Joint replacement      History reviewed. No pertinent family history.  Social History:  reports that she has never smoked. She does not have any smokeless tobacco history on file. She reports that she does not drink alcohol or use illicit drugs.  Allergies:  Allergies  Allergen  Reactions  . Hydrocodone Bitartrate Er Rash    Medications:  No current facility-administered medications on file prior to encounter.   Current Outpatient Prescriptions on File Prior to Encounter  Medication Sig Dispense Refill  . ondansetron (ZOFRAN ODT) 4 MG disintegrating tablet Take 1 tablet (4 mg total) by mouth every 8 (eight) hours as needed for nausea or vomiting. 12 tablet 1  . HYDROcodone-acetaminophen (NORCO/VICODIN) 5-325 MG per tablet Take 1-2 tablets by mouth every 6 (six) hours as needed for moderate pain. (Patient not taking: Reported on 09/24/2014) 20 tablet 0     Results for orders placed or performed during the hospital encounter of 09/24/14 (from the past 48 hour(s))  Urinalysis, Routine w reflex microscopic (not at [facility])     Status: Abnormal   Collection Time: 09/26/14 11:37 AM  Result Value Ref Range   Color, Urine AMBER (A) YELLOW    Comment: BIOCHEMICALS MAY BE AFFECTED BY COLOR   APPearance CLOUDY (A) CLEAR   Specific Gravity, Urine 1.005 1.005 - 1.030   pH 6.0 5.0 - 8.0   Glucose, UA NEGATIVE NEGATIVE mg/dL   Hgb urine dipstick TRACE (A) NEGATIVE   Bilirubin Urine NEGATIVE NEGATIVE   Ketones, ur NEGATIVE NEGATIVE mg/dL   Protein, ur NEGATIVE NEGATIVE mg/dL   Urobilinogen, UA 4.0 (H) 0.0 - 1.0 mg/dL   Nitrite NEGATIVE NEGATIVE   Leukocytes, UA LARGE (A) NEGATIVE  Urine microscopic-add on     Status: Abnormal   Collection Time: 09/26/14 11:37 AM  Result  Value Ref Range   Squamous Epithelial / LPF FEW (A) RARE   WBC, UA TOO NUMEROUS TO COUNT <3 WBC/hpf   Bacteria, UA FEW (A) RARE  Comprehensive metabolic panel     Status: Abnormal   Collection Time: 09/27/14  4:34 AM  Result Value Ref Range   Sodium 137 135 - 145 mmol/L   Potassium 3.6 3.5 - 5.1 mmol/L   Chloride 100 (L) 101 - 111 mmol/L   CO2 30 22 - 32 mmol/L   Glucose, Bld 103 (H) 65 - 99 mg/dL   BUN 6 6 - 20 mg/dL   Creatinine, Ser 0.69 0.44 - 1.00 mg/dL   Calcium 8.3 (L) 8.9 - 10.3 mg/dL    Total Protein 5.5 (L) 6.5 - 8.1 g/dL   Albumin 2.2 (L) 3.5 - 5.0 g/dL   AST 90 (H) 15 - 41 U/L   ALT 80 (H) 14 - 54 U/L   Alkaline Phosphatase 370 (H) 38 - 126 U/L   Total Bilirubin 1.7 (H) 0.3 - 1.2 mg/dL   GFR calc non Af Amer >60 >60 mL/min   GFR calc Af Amer >60 >60 mL/min    Comment: (NOTE) The eGFR has been calculated using the CKD EPI equation. This calculation has not been validated in all clinical situations. eGFR's persistently <60 mL/min signify possible Chronic Kidney Disease.    Anion gap 7 5 - 15  CBC     Status: Abnormal   Collection Time: 09/27/14  4:34 AM  Result Value Ref Range   WBC 5.9 4.0 - 10.5 K/uL   RBC 3.23 (L) 3.87 - 5.11 MIL/uL   Hemoglobin 10.9 (L) 12.0 - 15.0 g/dL   HCT 33.2 (L) 36.0 - 46.0 %   MCV 102.8 (H) 78.0 - 100.0 fL   MCH 33.7 26.0 - 34.0 pg   MCHC 32.8 30.0 - 36.0 g/dL   RDW 13.2 11.5 - 15.5 %   Platelets 202 150 - 400 K/uL    Mr 3d Recon At Scanner  09/25/2014   CLINICAL DATA:  81 year old with decreased appetite, weight loss and pruritis with elevated liver function studies. Biliary dilatation and concern of cholangiocarcinoma on CT.  EXAM: MRI ABDOMEN WITHOUT AND WITH CONTRAST (INCLUDING MRCP)  TECHNIQUE: Multiplanar multisequence MR imaging of the abdomen was performed both before and after the administration of intravenous contrast. Heavily T2-weighted images of the biliary and pancreatic ducts were obtained, and three-dimensional MRCP images were rendered by post processing.  CONTRAST:  20 ml MultiHance.  COMPARISON:  Abdominal CT 09/23/2014. Previous studies have been merged ; an older study from 08/18/2013 is now available as well.  FINDINGS: Unfortunately, the study is moderately motion degraded. The patient was not able to suspend respiration.  Lower chest:  Unremarkable.  Hepatobiliary: Again demonstrated is prominent segmental intrahepatic biliary dilatation anteriorly in the right hepatic lobe. The gallbladder is present without  definite stones or wall thickening. As demonstrated on recent CT, there is persistent concern of a possible intra biliary lesion, measuring 1.6 cm on image 21 of series 3. This could reflect a lymph node in the porta hepatis causing extrinsic compression of the biliary system. On MRI, the peripheral area of altered enhancement in segments 4B and 5, adjacent to the gallbladder appears more mass-like, measuring 4.8 x 3.5 cm on image 22. This does demonstrate enhancement following contrast. No other liver lesions identified. The extrahepatic biliary system appears normal in caliber.  Pancreas: Unremarkable. No pancreatic ductal dilatation or surrounding inflammatory changes.  Spleen: Normal in size without focal abnormality.  Adrenals/Urinary Tract: Both adrenal glands appear normal.Bilateral renal parapelvic cysts. No hydronephrosis.  Stomach/Bowel: No evidence of bowel wall thickening, distention or surrounding inflammatory change.  Vascular/Lymphatic: Stable mildly prominent lymph nodes within the porta hepatis. Atherosclerosis better demonstrated by CT.  Other: None.  Musculoskeletal: Heterogeneous marrow signal throughout the spine and sacrum without typical metastatic disease.  IMPRESSION: 1. The current examination does not better clarify the previously demonstrated findings on CT due to motion. However, there is persistent concern of malignant biliary obstruction from probable cholangiocarcinoma. This could be secondary to a peripheral tumor with central adenopathy or a central lesion with peripheral metastasis. 2. Tissue sampling recommended. 3. No distant metastases identified.   Electronically Signed   By: Richardean Sale M.D.   On: 09/25/2014 15:05   Mr Abd W/wo Cm/mrcp  09/25/2014   CLINICAL DATA:  81 year old with decreased appetite, weight loss and pruritis with elevated liver function studies. Biliary dilatation and concern of cholangiocarcinoma on CT.  EXAM: MRI ABDOMEN WITHOUT AND WITH CONTRAST  (INCLUDING MRCP)  TECHNIQUE: Multiplanar multisequence MR imaging of the abdomen was performed both before and after the administration of intravenous contrast. Heavily T2-weighted images of the biliary and pancreatic ducts were obtained, and three-dimensional MRCP images were rendered by post processing.  CONTRAST:  20 ml MultiHance.  COMPARISON:  Abdominal CT 09/23/2014. Previous studies have been merged ; an older study from 08/18/2013 is now available as well.  FINDINGS: Unfortunately, the study is moderately motion degraded. The patient was not able to suspend respiration.  Lower chest:  Unremarkable.  Hepatobiliary: Again demonstrated is prominent segmental intrahepatic biliary dilatation anteriorly in the right hepatic lobe. The gallbladder is present without definite stones or wall thickening. As demonstrated on recent CT, there is persistent concern of a possible intra biliary lesion, measuring 1.6 cm on image 21 of series 3. This could reflect a lymph node in the porta hepatis causing extrinsic compression of the biliary system. On MRI, the peripheral area of altered enhancement in segments 4B and 5, adjacent to the gallbladder appears more mass-like, measuring 4.8 x 3.5 cm on image 22. This does demonstrate enhancement following contrast. No other liver lesions identified. The extrahepatic biliary system appears normal in caliber.  Pancreas: Unremarkable. No pancreatic ductal dilatation or surrounding inflammatory changes.  Spleen: Normal in size without focal abnormality.  Adrenals/Urinary Tract: Both adrenal glands appear normal.Bilateral renal parapelvic cysts. No hydronephrosis.  Stomach/Bowel: No evidence of bowel wall thickening, distention or surrounding inflammatory change.  Vascular/Lymphatic: Stable mildly prominent lymph nodes within the porta hepatis. Atherosclerosis better demonstrated by CT.  Other: None.  Musculoskeletal: Heterogeneous marrow signal throughout the spine and sacrum without  typical metastatic disease.  IMPRESSION: 1. The current examination does not better clarify the previously demonstrated findings on CT due to motion. However, there is persistent concern of malignant biliary obstruction from probable cholangiocarcinoma. This could be secondary to a peripheral tumor with central adenopathy or a central lesion with peripheral metastasis. 2. Tissue sampling recommended. 3. No distant metastases identified.   Electronically Signed   By: Richardean Sale M.D.   On: 09/25/2014 15:05    Review of Systems  All other systems reviewed and are negative.  Blood pressure 189/61, pulse 56, temperature 98 F (36.7 C), temperature source Oral, resp. rate 17, height $RemoveBe'5\' 7"'dqmCVgOXL$  (1.702 m), weight 83 kg (182 lb 15.7 oz), SpO2 100 %. Physical Exam  Constitutional: She appears well-developed and well-nourished. No distress.  HENT:  Head: Normocephalic and atraumatic.  Eyes: Right eye exhibits no discharge. Left eye exhibits no discharge. No scleral icterus.  Cardiovascular: Normal rate, regular rhythm, normal heart sounds and intact distal pulses.  Exam reveals no gallop and no friction rub.   No murmur heard. Respiratory: Effort normal and breath sounds normal. No respiratory distress. She has no wheezes. She has no rales. She exhibits no tenderness.  GI: Soft. Bowel sounds are normal. She exhibits no distension and no mass. There is no tenderness. There is no rebound and no guarding.  Musculoskeletal: Normal range of motion. She exhibits no edema or tenderness.  Skin: Skin is warm and dry. No rash noted. She is not diaphoretic. No erythema. No pallor.  Psychiatric: She has a normal mood and affect. Her behavior is normal. Judgment and thought content normal.    Assessment/Plan: Abdominal pain Abnormal LFTs Hyperbilirubinemia  Concerns for cholangiocarcinoma  We need tissue sampling for further diagnosis. CEA and CA 19-9 pending Further recs to follow after further discussion  with Dr. Barry Dienes.   Landa Mullinax ANP-BC 09/27/2014, 11:22 AM

## 2014-09-27 NOTE — Anesthesia Procedure Notes (Signed)
Procedure Name: MAC Date/Time: 09/27/2014 9:38 AM Performed by: Jenne Campus Pre-anesthesia Checklist: Patient identified, Emergency Drugs available, Suction available, Patient being monitored and Timeout performed Patient Re-evaluated:Patient Re-evaluated prior to inductionOxygen Delivery Method: Nasal cannula

## 2014-09-27 NOTE — Progress Notes (Signed)
[facility] PROGRESS NOTE  Krista Bentley SNK:[id number] DOB: 01/15/1934 DOA: 09/24/2014 PCP: Idamae Schuller, MD  Assessment/Plan: 1-Obstructive Jaundice:  Transaminases.  CT abdomen : with intrahepatic biliary dilation, possible peripheral mass liver. Concern for cholangiocarcinoma.  MRCP: there is persistent concern of malignant biliary obstruction from probable cholangiocarcinoma. This could be secondary to a peripheral tumor with central adenopathy or a central lesion with peripheral metastasis. Dr Michail Sermon consulted and helping with management.  bilirubin  Decreasing 4---3---1.7. Continue with  protonix.  -S/P Endoscopy [place]; Right intrahepatic biliary dilatation, suspected cholangiocarcinoma in the peripheral right main intrahepatic duct, but unable to be visualized via EUS. No lesion visible for EUS-guided biopsy. GI recommending surgical consultation.  Follow surgery recommendation.   2-HNT; continue with verapamil.   3-DVT prophylaxis. SCD;  4-UTI: Dysuria: UA with too numerous to count WBC. Continue with ceftriaxone day 2. Follow urine culture.  5-Constipation; continue with miralax. Will add senna.   Code Status: Full Code.  Family Communication: care discussed with patient.  Disposition Plan: awaiting GI evaluation    Consultants:  Dr Michail Sermon.   Procedures:  MRCP  Antibiotics:  none  HPI/Subjective Just came from EUS. Abdominal pain on and off. Dysuria better.    Objective: Filed Vitals:   09/27/14 1020  BP: 189/61  Pulse: 56  Temp:   Resp: 17    Intake/Output Summary (Last 24 hours) at 09/27/14 1342 Last data filed at 09/27/14 0951  Gross per 24 hour  Intake   1276 ml  Output      0 ml  Net   1276 ml   Filed Weights   09/24/14 1552 09/25/14 0700  Weight: 83.008 kg (183 lb) 83 kg (182 lb 15.7 oz)    Exam:   General:  Alert in no distress.   Cardiovascular: S 1, S 2 RRR  Respiratory: CTA  Abdomen: BS present, soft, RUQ  tenderness.   Musculoskeletal: no edema  Data Reviewed: Basic Metabolic Panel:  Recent Labs Lab 09/23/14 0750 09/24/14 1612 09/25/14 0348 09/27/14 0434  NA 135 133* 136 137  K 4.1 4.0 3.8 3.6  CL 99* 95* 98* 100*  CO2 28 28 28 30   GLUCOSE 127* 115* 96 103*  BUN 15 9 9 6   CREATININE 0.71 0.78 0.72 0.69  CALCIUM 8.9 9.1 8.9 8.3*   Liver Function Tests:  Recent Labs Lab 09/23/14 0750 09/24/14 1612 09/25/14 0348 09/27/14 0434  AST 152* 159* 125* 90*  ALT 139* 141* 115* 80*  ALKPHOS 448* 440* 349* 370*  BILITOT 1.3* 4.1* 3.4* 1.7*  PROT 6.8 6.6 6.1* 5.5*  ALBUMIN 3.2* 3.0* 2.7* 2.2*    Recent Labs Lab 09/23/14 0750 09/24/14 1612  LIPASE 11* 16*   No results for input(s): AMMONIA in the last 168 hours. CBC:  Recent Labs Lab 09/23/14 0750 09/24/14 1612 09/25/14 0348 09/27/14 0434  WBC 7.3 9.9 8.9 5.9  NEUTROABS 5.8 7.5  --   --   HGB 12.2 13.1 12.1 10.9*  HCT 37.5 39.0 36.7 33.2*  MCV 102.7* 101.3* 102.2* 102.8*  PLT 209 217 195 202   Cardiac Enzymes: No results for input(s): CKTOTAL, CKMB, CKMBINDEX, TROPONINI in the last 168 hours. BNP (last 3 results) No results for input(s): BNP in the last 8760 hours.  ProBNP (last 3 results) No results for input(s): PROBNP in the last 8760 hours.  CBG: No results for input(s): GLUCAP in the last 168 hours.  No results found for this or any previous visit (from the past 240 hour(s)).  Studies: No results found.  Scheduled Meds: . cefTRIAXone (ROCEPHIN)  IV  1 g Intravenous Q24H  . pantoprazole  40 mg Oral Daily  . polyethylene glycol  17 g Oral BID  . verapamil  180 mg Oral QHS   Continuous Infusions: . sodium chloride 75 mL/hr at 09/25/14 2248    Active Problems:   Cholangiocarcinoma   Abdominal pain   Nausea & vomiting   HTN (hypertension)   Elevated LFTs    Time spent: 25 minutes.     [name] A  [facility] Pager [phone]. If 7PM-7AM, please contact night-coverage  at [email or web address], password [facility] 09/27/2014, 1:42 PM  LOS: 3 days

## 2014-09-27 NOTE — Transfer of Care (Signed)
Immediate Anesthesia Transfer of Care Note  Patient: Krista Bentley  Procedure(s) Performed: Procedure(s): ESOPHAGEAL ENDOSCOPIC ULTRASOUND (EUS) RADIAL (N/A)  Patient Location: Endoscopy Unit  Anesthesia Type:MAC  Level of Consciousness: awake, oriented and patient cooperative  Airway & Oxygen Therapy: Patient Spontanous Breathing and Patient connected to nasal cannula oxygen  Post-op Assessment: Report given to RN and Post -op Vital signs reviewed and stable  Post vital signs: Reviewed  Last Vitals:  Filed Vitals:   09/27/14 1010  BP: 182/64  Pulse: 58  Temp:   Resp: 23    Complications: No apparent anesthesia complications

## 2014-09-27 NOTE — Care Management Note (Signed)
Case Management Note  Patient Details  Name: Krista Bentley MRN: [id number] Date of Birth: 1933-11-19  Subjective/Objective:  Pt admitted on 09/24/14 with cholangiocarcinoma.  PTA, pt resided at home with husband and daughter.                  Action/Plan: Will follow for discharge planning as pt progresses.    Expected Discharge Date:                  Expected Discharge Plan:  [place]  In-House Referral:     Discharge planning Services  CM Consult  Post Acute Care Choice:    Choice offered to:     DME Arranged:    DME Agency:     HH Arranged:    [place] Agency:     Status of Service:  In process, will continue to follow  Medicare Important Message Given:   yes Date Medicare IM Given:   09/27/14 Medicare IM give by:    Ellan Lambert, RN, BSN Date Additional Medicare IM Given:    Additional Medicare Important Message give by:     If discussed at [place] of Stay Meetings, dates discussed:    Additional Comments:  Reinaldo Raddle, RN, BSN  Trauma/Neuro ICU Case Manager [phone]

## 2014-09-27 NOTE — H&P (View-Only) (Signed)
Patient ID: Krista Bentley, female   DOB: Nov 03, 1933, 81 y.o.   MRN: [id number] [facility] Gastroenterology Progress Note  Krista Bentley 81 y.o. 1934-02-07   Subjective: Reports increase in RUQ and epigastric pain today. Denies N/V. Tolerating liquid diet. Nurse at bedside.  Objective: Vital signs in last 24 hours: Filed Vitals:   09/26/14 0625  BP: 132/55  Pulse: 54  Temp: 98.4 F (36.9 C)  Resp: 18    Physical Exam: Gen: alert, no acute distress, elderly, well-nourished CV: RRR Chest: CTA B Abd: minimal RUQ and right mid-quadrant tenderness with guarding, otherwise nontender, soft, nondistended, +BS Skin: no rash  Lab Results:  Recent Labs  09/24/14 1612 09/25/14 0348  NA 133* 136  K 4.0 3.8  CL 95* 98*  CO2 28 28  GLUCOSE 115* 96  BUN 9 9  CREATININE 0.78 0.72  CALCIUM 9.1 8.9    Recent Labs  09/24/14 1612 09/25/14 0348  AST 159* 125*  ALT 141* 115*  ALKPHOS 440* 349*  BILITOT 4.1* 3.4*  PROT 6.6 6.1*  ALBUMIN 3.0* 2.7*    Recent Labs  09/24/14 1612 09/25/14 0348  WBC 9.9 8.9  NEUTROABS 7.5  --   HGB 13.1 12.1  HCT 39.0 36.7  MCV 101.3* 102.2*  PLT 217 195   No results for input(s): LABPROT, INR in the last 72 hours.    Assessment/Plan: Obstructive jaundice with imaging concerning for a cholangiocarcinoma. Recheck LFTs. Tentatively scheduled for ERCP tomorrow morning for stent placement and brushing of bile duct to obtain tissue diagnosis. NPO p MN. Supportive care.   [place] C. 09/26/2014, 1:18 PM  Pager [phone]  If no answer or after 5 PM call [phone]

## 2014-09-27 NOTE — Anesthesia Postprocedure Evaluation (Signed)
  Anesthesia Post-op Note  Patient: Krista Bentley  Procedure(s) Performed: Procedure(s): ESOPHAGEAL ENDOSCOPIC ULTRASOUND (EUS) RADIAL (N/A)  Patient Location: Endoscopy Unit  Anesthesia Type:MAC  Level of Consciousness: awake and alert   Airway and Oxygen Therapy: Patient Spontanous Breathing  Post-op Pain: none  Post-op Assessment: Post-op Vital signs reviewed              Post-op Vital Signs: stable  Last Vitals:  Filed Vitals:   09/27/14 1020  BP: 189/61  Pulse: 56  Temp:   Resp: 17    Complications: No apparent anesthesia complications

## 2014-09-28 ENCOUNTER — Encounter (HOSPITAL_COMMUNITY): Payer: Self-pay | Admitting: Gastroenterology

## 2014-09-28 DIAGNOSIS — R7989 Other specified abnormal findings of blood chemistry: Secondary | ICD-10-CM

## 2014-09-28 DIAGNOSIS — I10 Essential (primary) hypertension: Secondary | ICD-10-CM

## 2014-09-28 DIAGNOSIS — C221 Intrahepatic bile duct carcinoma: Principal | ICD-10-CM

## 2014-09-28 DIAGNOSIS — R1110 Vomiting, unspecified: Secondary | ICD-10-CM

## 2014-09-28 LAB — COMPREHENSIVE METABOLIC PANEL
ALK PHOS: 388 U/L — AB (ref 38–126)
ALT: 72 U/L — AB (ref 14–54)
AST: 75 U/L — ABNORMAL HIGH (ref 15–41)
Albumin: 2.4 g/dL — ABNORMAL LOW (ref 3.5–5.0)
Anion gap: 7 (ref 5–15)
BUN: 6 mg/dL (ref 6–20)
CALCIUM: 8.6 mg/dL — AB (ref 8.9–10.3)
CO2: 33 mmol/L — ABNORMAL HIGH (ref 22–32)
Chloride: 98 mmol/L — ABNORMAL LOW (ref 101–111)
Creatinine, Ser: 0.73 mg/dL (ref 0.44–1.00)
GFR calc Af Amer: >60 mL/min (ref >60)
Glucose, Bld: 102 mg/dL — ABNORMAL HIGH (ref 65–99)
POTASSIUM: 3.7 mmol/L (ref 3.5–5.1)
SODIUM: 138 mmol/L (ref 135–145)
TOTAL PROTEIN: 6.1 g/dL — AB (ref 6.5–8.1)
Total Bilirubin: 1.5 mg/dL — ABNORMAL HIGH (ref 0.3–1.2)

## 2014-09-28 LAB — CBC
HCT: 36.1 % (ref 36.0–46.0)
HEMOGLOBIN: 11.9 g/dL — AB (ref 12.0–15.0)
MCH: 34 pg (ref 26.0–34.0)
MCHC: 33 g/dL (ref 30.0–36.0)
MCV: 103.1 fL — AB (ref 78.0–100.0)
Platelets: 236 10*3/uL (ref 150–400)
RBC: 3.5 MIL/uL — ABNORMAL LOW (ref 3.87–5.11)
RDW: 13.3 % (ref 11.5–15.5)
WBC: 6.4 10*3/uL (ref 4.0–10.5)

## 2014-09-28 LAB — CEA: CEA: 1.2 ng/mL (ref 0.0–4.7)

## 2014-09-28 LAB — CANCER ANTIGEN 19-9: CA 19 9: 159 U/mL — AB (ref 0–35)

## 2014-09-28 MED ORDER — MORPHINE SULFATE ER 15 MG PO TBCR: 15.0000 mg | EXTENDED_RELEASE_TABLET | Freq: Two times a day (BID) | ORAL | Status: AC

## 2014-09-28 MED ORDER — POLYETHYLENE GLYCOL 3350 17 G PO PACK: 17.0000 g | PACK | ORAL | Status: DC | PRN

## 2014-09-28 MED ORDER — OXYCODONE HCL 5 MG PO TABS: 5.0000 mg | ORAL_TABLET | ORAL | Status: AC | PRN

## 2014-09-28 MED ORDER — CIPROFLOXACIN HCL 500 MG PO TABS: 500.0000 mg | ORAL_TABLET | Freq: Two times a day (BID) | ORAL | Status: DC

## 2014-09-28 MED ORDER — CIPROFLOXACIN HCL 500 MG PO TABS: 500.0000 mg | ORAL_TABLET | Freq: Two times a day (BID) | ORAL | Status: AC

## 2014-09-28 MED ORDER — POLYETHYLENE GLYCOL 3350 17 G PO PACK: 17.0000 g | PACK | ORAL | Status: AC | PRN

## 2014-09-28 NOTE — Discharge Summary (Addendum)
Discharge Summary  [name]:[id number] DOB: 02-Feb-1934  PCP: Idamae Schuller, MD  Admit date: 09/24/2014 Discharge date: 09/28/2014  Time spent: 35 minutes  Recommendations for Outpatient Follow-up:  1. Patient will follow-up with her PCP in the next one month. 2. Patient has a scheduled appointment with Dr. Crisoforo Oxford, oncologic surgery this Friday, 6/17 at 1:45 PM at [facility] 3. New medications: Cipro 500 by mouth twice a day 4 more days 4. New medications: OxyIR 5 mg every 4 hours as needed for breakthrough pain 5. New medication: MS Contin 15 mg by mouth every 12 hours scheduled extended-release 6. Medication change: Zocor discontinued for now until liver functions and issue better clarified 7. Medication change: Ultram discontinued  Discharge Diagnoses:  Active Hospital Problems   Diagnosis Date Noted  . Elevated LFTs   . Cholangiocarcinoma 09/24/2014  . Abdominal pain 09/24/2014  . Nausea & vomiting 09/24/2014  . HTN (hypertension) 09/24/2014    Resolved Hospital Problems   Diagnosis Date Noted Date Resolved  No resolved problems to display.    Discharge Condition: Improved, being discharged home  Diet recommendation: Low-fat  Filed Weights   09/24/14 1552 09/25/14 0700  Weight: 83.008 kg (183 lb) 83 kg (182 lb 15.7 oz)    History of present illness:  81 year old female with past medical history of hypertension admitted on 6/10 for 5 days of epigastric and right upper quadrant abdominal pain along with noted 10 pound weight loss over past year unintentionally. A CT scan done the day prior revealed findings concerning for cholangiocarcinoma. Patient brought in to the hospital service and GI consulted.  Patient also found to have UTI.  Hospital Course:  Active Problems:   Cholangiocarcinoma: Seen by GI and underwent ERCP on 6/13. Unfortunately no visual lesion for biopsy. Surgery consulted, but did not have any recommendations without tissue sample. They  recommended tertiary care facility referral. By 6/14, patient LFTs trending down to near normal. Tolerating by mouth. Felt to be stable and felt to be okay to discharge, referral made and patient has focal comment at [facility] in several days. We'll discontinue Zocor for now until liver issues better clarified. Patient required around-the-clock IV pain medication and so discharged on both scheduled extended-release plus when necessary pain medication.  UTI: Initially started on Rocephin. Discharged on by mouth Cipro. Urine grew out pansensitive enterococcus.    Abdominal pain with nausea and vomiting: Felt to be secondary to intermittent obstruction, now resolved, tolerating by mouth    HTN (hypertension): Stable   Elevated LFTs: Secondary to intermittent obstruction, resolving   Procedures:  ERCP done 6/13: No focal mass, diffusely heterogenous pancreatic parenchyma area gallbladder appeared normal. Right intrahepatic biliary dilatation noted. Suspect cholangiocarcinoma and peripheral right main intrahepatic duct but unable to be visualized so no lesion for biopsy  Consultations:  General surgery  Gastroenterology  Discharge Exam: BP 131/48 mmHg  Pulse 54  Temp(Src) 98 F (36.7 C) (Oral)  Resp 16  Ht 5\' 7"  (1.702 m)  Wt 83 kg (182 lb 15.7 oz)  BMI 28.65 kg/m2  SpO2 93%  General: Alert and oriented 3, no acute distress Cardiovascular: Regular rate and rhythm, S1 and S2 Respiratory: Clear to auscultation bilaterally  Discharge Instructions You were cared for by a hospitalist during your hospital stay. If you have any questions about your discharge medications or the care you received while you were in the hospital after you are discharged, you can call the unit and asked to speak with the hospitalist on call  if the hospitalist that took care of you is not available. Once you are discharged, your primary care physician will handle any further medical issues. Please note that  NO REFILLS for any discharge medications will be authorized once you are discharged, as it is imperative that you return to your primary care physician (or establish a relationship with a primary care physician if you do not have one) for your aftercare needs so that they can reassess your need for medications and monitor your lab values.  Discharge Instructions    Diet - low sodium heart healthy    Complete by:  As directed      Increase activity slowly    Complete by:  As directed             Medication List    STOP taking these medications        HYDROcodone-acetaminophen 5-325 MG per tablet  Commonly known as:  NORCO/VICODIN     simvastatin 40 MG tablet  Commonly known as:  ZOCOR     traMADol 50 MG tablet  Commonly known as:  ULTRAM      TAKE these medications        ciprofloxacin 500 MG tablet  Commonly known as:  CIPRO  Take 1 tablet (500 mg total) by mouth 2 (two) times daily.     dicyclomine 10 MG capsule  Commonly known as:  BENTYL  Take 10 mg by mouth at bedtime.     estrogens (conjugated) 0.625 MG tablet  Commonly known as:  PREMARIN  Take 0.625 mg by mouth See admin instructions. Take daily for 21 days then do not take for 7 days.. Take on Mondays wednesdays and fridays     meloxicam 7.5 MG tablet  Commonly known as:  MOBIC  Take 7.5 mg by mouth at bedtime.     morphine 15 MG 12 hr tablet  Commonly known as:  MS CONTIN  Take 1 tablet (15 mg total) by mouth every 12 (twelve) hours.     ondansetron 4 MG disintegrating tablet  Commonly known as:  ZOFRAN ODT  Take 1 tablet (4 mg total) by mouth every 8 (eight) hours as needed for nausea or vomiting.     ondansetron 4 MG tablet  Commonly known as:  ZOFRAN  Take 4 mg by mouth every 8 (eight) hours as needed for nausea or vomiting.     oxyCODONE 5 MG immediate release tablet  Commonly known as:  Oxy IR/ROXICODONE  Take 1 tablet (5 mg total) by mouth every 4 (four) hours as needed for moderate pain.      pantoprazole 40 MG tablet  Commonly known as:  PROTONIX  Take 40 mg by mouth 2 (two) times daily.     polyethylene glycol packet  Commonly known as:  MIRALAX / GLYCOLAX  Take 17 g by mouth every other day as needed for moderate constipation.     verapamil 180 MG (CO) 24 hr tablet  Commonly known as:  COVERA HS  Take 180 mg by mouth at bedtime.       Allergies  Allergen Reactions  . Hydrocodone Bitartrate Er Rash       Follow-up Information    Follow up with Jyl Heinz, MD On 10/01/2014.   Specialty:  Surgery   Why:  Be there at 1:45pm with list of your medicines, photo ID & insurance information   Contact information:   [address] [place] [address] [phone]  The results of significant diagnostics from this hospitalization (including imaging, microbiology, ancillary and laboratory) are listed below for reference.    Significant Diagnostic Studies: Ct Abdomen Pelvis W Contrast  09/23/2014   CLINICAL DATA:  Right lower quadrant abdominal pain with nausea since last night. Initial encounter.  EXAM: CT ABDOMEN AND PELVIS WITH CONTRAST  TECHNIQUE: Multidetector CT imaging of the abdomen and pelvis was performed using the standard protocol following bolus administration of intravenous contrast.  CONTRAST:  160mL OMNIPAQUE IOHEXOL 300 MG/ML  SOLN  COMPARISON:  None.  FINDINGS: Lower chest: Clear lung bases. No significant pleural or pericardial effusion.  Hepatobiliary: There is prominent segmental intrahepatic biliary dilatation anteriorly in the right hepatic lobe. There is heterogeneous enhancement within segments 4B and 5, measuring 5.1 x 3.6 cm on image 24, likely altered perfusion from the biliary dilatation as the dilated bile ducts pass through this lesion. A peripheral hypervascular mass is considered less likely. There is suspicion of a central liver mass causing biliary obstruction, potentially intraductal. This is most obvious  on sagittal images 44 through 52. No other focal hepatic lesions identified. The gallbladder appears normal without gallstones or wall thickening. The extrahepatic biliary system is nondilated.  Pancreas: Unremarkable. No pancreatic ductal dilatation or surrounding inflammatory changes.  Spleen: Normal in size without focal abnormality.  Adrenals/Urinary Tract: Both adrenal glands appear normal.Both kidneys demonstrate parapelvic cysts. There is no evidence of renal mass, hydronephrosis or urinary tract calculus. The bladder appears unremarkable.  Stomach/Bowel: No evidence of bowel wall thickening, distention or surrounding inflammatory change.There is a possible small appendiceal stump suggesting previous appendectomy. No pericecal inflammatory changes identified.  Vascular/Lymphatic: Several small lymph nodes within the porta hepatis are not pathologically enlarged and are nonspecific. There is no retroperitoneal or pelvic lymphadenopathy. There is moderate atherosclerosis of the aorta, its branches and the iliac arteries.  Reproductive: Status post hysterectomy. No evidence of adnexal mass.  Other: No evidence of abdominal wall mass or hernia.  Musculoskeletal: No acute or significant osseous findings. Mild lumbar spondylosis noted.  IMPRESSION: 1. Segmental intrahepatic biliary dilatation highly suspicious for central cholangiocarcinoma. Focal hypervascular lesion within segments 4B and 5 is likely due to altered perfusion, although potentially a peripheral mass lesion. Further evaluation with MRCP without and with contrast recommended. 2. No distant metastases identified. There are not specific small lymph nodes within the porta hepatis. 3. No acute inflammatory changes identified. Suspected previous appendectomy at time of hysterectomy. 4. Moderate atherosclerosis.   Electronically Signed   By: Richardean Sale M.D.   On: 09/23/2014 09:23   Mr 3d Recon At Scanner  09/25/2014   CLINICAL DATA:  81 year old  with decreased appetite, weight loss and pruritis with elevated liver function studies. Biliary dilatation and concern of cholangiocarcinoma on CT.  EXAM: MRI ABDOMEN WITHOUT AND WITH CONTRAST (INCLUDING MRCP)  TECHNIQUE: Multiplanar multisequence MR imaging of the abdomen was performed both before and after the administration of intravenous contrast. Heavily T2-weighted images of the biliary and pancreatic ducts were obtained, and three-dimensional MRCP images were rendered by post processing.  CONTRAST:  20 ml MultiHance.  COMPARISON:  Abdominal CT 09/23/2014. Previous studies have been merged ; an older study from 08/18/2013 is now available as well.  FINDINGS: Unfortunately, the study is moderately motion degraded. The patient was not able to suspend respiration.  Lower chest:  Unremarkable.  Hepatobiliary: Again demonstrated is prominent segmental intrahepatic biliary dilatation anteriorly in the right hepatic lobe. The gallbladder is present without definite stones or wall  thickening. As demonstrated on recent CT, there is persistent concern of a possible intra biliary lesion, measuring 1.6 cm on image 21 of series 3. This could reflect a lymph node in the porta hepatis causing extrinsic compression of the biliary system. On MRI, the peripheral area of altered enhancement in segments 4B and 5, adjacent to the gallbladder appears more mass-like, measuring 4.8 x 3.5 cm on image 22. This does demonstrate enhancement following contrast. No other liver lesions identified. The extrahepatic biliary system appears normal in caliber.  Pancreas: Unremarkable. No pancreatic ductal dilatation or surrounding inflammatory changes.  Spleen: Normal in size without focal abnormality.  Adrenals/Urinary Tract: Both adrenal glands appear normal.Bilateral renal parapelvic cysts. No hydronephrosis.  Stomach/Bowel: No evidence of bowel wall thickening, distention or surrounding inflammatory change.  Vascular/Lymphatic: Stable mildly  prominent lymph nodes within the porta hepatis. Atherosclerosis better demonstrated by CT.  Other: None.  Musculoskeletal: Heterogeneous marrow signal throughout the spine and sacrum without typical metastatic disease.  IMPRESSION: 1. The current examination does not better clarify the previously demonstrated findings on CT due to motion. However, there is persistent concern of malignant biliary obstruction from probable cholangiocarcinoma. This could be secondary to a peripheral tumor with central adenopathy or a central lesion with peripheral metastasis. 2. Tissue sampling recommended. 3. No distant metastases identified.   Electronically Signed   By: Richardean Sale M.D.   On: 09/25/2014 15:05   Mr Abd W/wo Cm/mrcp  09/25/2014   CLINICAL DATA:  81 year old with decreased appetite, weight loss and pruritis with elevated liver function studies. Biliary dilatation and concern of cholangiocarcinoma on CT.  EXAM: MRI ABDOMEN WITHOUT AND WITH CONTRAST (INCLUDING MRCP)  TECHNIQUE: Multiplanar multisequence MR imaging of the abdomen was performed both before and after the administration of intravenous contrast. Heavily T2-weighted images of the biliary and pancreatic ducts were obtained, and three-dimensional MRCP images were rendered by post processing.  CONTRAST:  20 ml MultiHance.  COMPARISON:  Abdominal CT 09/23/2014. Previous studies have been merged ; an older study from 08/18/2013 is now available as well.  FINDINGS: Unfortunately, the study is moderately motion degraded. The patient was not able to suspend respiration.  Lower chest:  Unremarkable.  Hepatobiliary: Again demonstrated is prominent segmental intrahepatic biliary dilatation anteriorly in the right hepatic lobe. The gallbladder is present without definite stones or wall thickening. As demonstrated on recent CT, there is persistent concern of a possible intra biliary lesion, measuring 1.6 cm on image 21 of series 3. This could reflect a lymph node in  the porta hepatis causing extrinsic compression of the biliary system. On MRI, the peripheral area of altered enhancement in segments 4B and 5, adjacent to the gallbladder appears more mass-like, measuring 4.8 x 3.5 cm on image 22. This does demonstrate enhancement following contrast. No other liver lesions identified. The extrahepatic biliary system appears normal in caliber.  Pancreas: Unremarkable. No pancreatic ductal dilatation or surrounding inflammatory changes.  Spleen: Normal in size without focal abnormality.  Adrenals/Urinary Tract: Both adrenal glands appear normal.Bilateral renal parapelvic cysts. No hydronephrosis.  Stomach/Bowel: No evidence of bowel wall thickening, distention or surrounding inflammatory change.  Vascular/Lymphatic: Stable mildly prominent lymph nodes within the porta hepatis. Atherosclerosis better demonstrated by CT.  Other: None.  Musculoskeletal: Heterogeneous marrow signal throughout the spine and sacrum without typical metastatic disease.  IMPRESSION: 1. The current examination does not better clarify the previously demonstrated findings on CT due to motion. However, there is persistent concern of malignant biliary obstruction from probable cholangiocarcinoma.  This could be secondary to a peripheral tumor with central adenopathy or a central lesion with peripheral metastasis. 2. Tissue sampling recommended. 3. No distant metastases identified.   Electronically Signed   By: Richardean Sale M.D.   On: 09/25/2014 15:05    Microbiology: Recent Results (from the past 240 hour(s))  Urine culture     Status: None (Preliminary result)   Collection Time: 09/26/14 11:37 AM  Result Value Ref Range Status   Specimen Description URINE, CLEAN CATCH  Final   Special Requests NONE  Final   Colony Count   Final    >=100,000 COLONIES/ML Performed at [facility]    Culture   Final    ENTEROCOCCUS SPECIES Performed at [facility]    Report Status PENDING   Incomplete     Labs: Basic Metabolic Panel:  Recent Labs Lab 09/23/14 0750 09/24/14 1612 09/25/14 0348 09/27/14 0434 09/28/14 0437  NA 135 133* 136 137 138  K 4.1 4.0 3.8 3.6 3.7  CL 99* 95* 98* 100* 98*  CO2 28 28 28 30  33*  GLUCOSE 127* 115* 96 103* 102*  BUN 15 9 9 6 6   CREATININE 0.71 0.78 0.72 0.69 0.73  CALCIUM 8.9 9.1 8.9 8.3* 8.6*   Liver Function Tests:  Recent Labs Lab 09/23/14 0750 09/24/14 1612 09/25/14 0348 09/27/14 0434 09/28/14 0437  AST 152* 159* 125* 90* 75*  ALT 139* 141* 115* 80* 72*  ALKPHOS 448* 440* 349* 370* 388*  BILITOT 1.3* 4.1* 3.4* 1.7* 1.5*  PROT 6.8 6.6 6.1* 5.5* 6.1*  ALBUMIN 3.2* 3.0* 2.7* 2.2* 2.4*    Recent Labs Lab 09/23/14 0750 09/24/14 1612  LIPASE 11* 16*   No results for input(s): AMMONIA in the last 168 hours. CBC:  Recent Labs Lab 09/23/14 0750 09/24/14 1612 09/25/14 0348 09/27/14 0434 09/28/14 0437  WBC 7.3 9.9 8.9 5.9 6.4  NEUTROABS 5.8 7.5  --   --   --   HGB 12.2 13.1 12.1 10.9* 11.9*  HCT 37.5 39.0 36.7 33.2* 36.1  MCV 102.7* 101.3* 102.2* 102.8* 103.1*  PLT 209 217 195 202 236   Cardiac Enzymes: No results for input(s): CKTOTAL, CKMB, CKMBINDEX, TROPONINI in the last 168 hours. BNP: BNP (last 3 results) No results for input(s): BNP in the last 8760 hours.  ProBNP (last 3 results) No results for input(s): PROBNP in the last 8760 hours.  CBG: No results for input(s): GLUCAP in the last 168 hours.     Signed:  Annita Brod  [facility] 09/28/2014, 8:12 PM

## 2014-09-28 NOTE — Progress Notes (Signed)
Surgical note appreciated. Patient's LFTs are slowly down-trending.  Patient is desirous of surgical opinion at [facility].  Given her declining LFTs, and since surgeons here and patient would like tertiary center evaluation,  I don't think there is pressing need to do percutaneous drain or biopsy or other intervention here, as long as surgeons at [facility] can see her within the next one week as outpatient.  There is unfortunately little left for [place] to offer.  Will sign-off; please call with questions; thank you for the consultation.

## 2014-09-28 NOTE — Discharge Instructions (Signed)
Liver Cancer Cells divide to form new cells when the body needs them. That happens in the liver, just as it does in other organs. Sometimes, the cells divide too rapidly. Old cells do not die off, and the process gets out of control. A growth (tumor) forms. That is how liver cancer starts. The liver is an important organ of the body. It is located on the upper right side of the belly (abdomen), just below the ribs. It is the largest organ in the body. In an adult man, it is about the size of a football. The liver stores sugar and iron. It also cleans (filters) harmful substances out of the blood. CAUSES  Scientists do not know exactly why cells start to divide rapidly in the liver to form a tumor. It is known that certain behaviors and conditions (risk factors) make liver cancer more likely to develop. They include:  Being female. Men older than 50 have liver cancer more often than other people do.  Having scarring of the liver (cirrhosis).  This occurs when liver cells are damaged and replaced with scar tissue.  Heavy drinking of alcohol for many years can cause cirrhosis, as well as being infected with the hepatitis B or hepatitis C virus (HBV or HCV). HBV and HCV are spread through blood and sexual contact.  Having certain liver diseases, such as:  Hemochromatosis. This disease causes too much iron to be stored in the liver.  Autoimmune hepatitis. In this disease, the body's immune system turns against the liver.  Wilson disease. This disease happens when copper builds up in the liver.  Having diabetes, particularly if you also drink alcohol heavily or have chronic viral hepatitis.  Being overweight (obese).  Exposure to aflatoxins. These are substances made by certain types of mold. They can form on peanuts, corn, wheat, soybeans, and rice. This is not a common cause of liver cancer in the [place] where foods are tested for aflatoxins. SYMPTOMS  Oftentimes, liver cancer  has few symptoms in the beginning. Sometimes, there are none. As the cancer grows, symptoms may include:  Weight loss without dieting.  Loss of appetite.  Nausea.  Feeling very weak and tired.  Pain on the right side of the belly (abdomen).  Feeling full or bloated.  Running a fever for no reason.  The skin or eyes become yellow in color (jaundice).  Dark-colored urine. DIAGNOSIS  Your caregiver may suspect that you have liver cancer based on your symptoms and your physical exam. Other tests will likely be necessary. These may include:  Blood tests, including a test called alpha fetoprotein (AFP). The blood contains more of this protein if someone has liver cancer.  Imaging tests. These tests may be able to detect cancer or a tumor. These tests include CT scan, MRI scan, or ultrasound.  Liver biopsy. Using medicine to numb your skin, a specialist can insert a needle into the liver and remove some cells. These cells are examined under a microscope to determine if cancer is present. This is the best way to be certain of the diagnosis. TREATMENT  Liver cancer can be treated many ways. The type of treatment will depend on:  The stage of the cancer.  Your age and overall health.  The condition of the liver. This means how well it is working and whether cirrhosis is present. Treatments can be used alone or in combination. Options include:  Surgery.  The part of the liver that has cancer may  be removed.  The whole liver may be removed. It would be replaced with a healthy liver (liver transplant).  Radiation. High-energy X-rays kill the cancer cells.  External radiation beams rays from a machine outside the body.  Internal radiation uses tiny spheres (like beads) that are put into the body. They give off radiation.  Chemotherapy. This treatment uses drugs that kill cancer cells. Options include:  Intravenous chemotherapy. Drugs are put into the blood to travel through the  body.  Targeted chemotherapy. This uses a drug that kills liver cancer by blocking the cancer's blood supply.  Chemoembolization. Drugs are injected directly into the liver to kill cancer cells.  Cryoablation. This involves killing the cancer cells by freezing them.  Radiofrequency ablation. This procedure kills the cancer cells by heating them with an electric current. HOME CARE INSTRUCTIONS   Learn as much as you can about liver cancer. It is important to be an informed patient.  Work closely with your caregivers. Fighting cancer takes a team approach.  Take medicines for pain only as prescribed by your caregiver. Follow the directions carefully. Ask before taking any over-the-counter drugs. Be aware that medicines containing acetaminophen can add to liver damage.  Pay attention to what you eat. Nutrition is an important part of recovering from liver cancer. Having this cancer can take away your appetite. So can some of the treatments for it. You might need to limit salt. It is also important to get the right amount of protein. Ask your caregiver for advice or talk with a nutritionist.  Consider joining a support group. Learning to live with a serious health problem, such as liver cancer, can be difficult. Friends and family can help. Many people also find it helpful to talk with others who are going through the same things you are. Ask your caregiver for a list of groups in your area.  Get rest.  Do not drink alcoholic beverages at all.  Get vaccinated against hepatitis A and hepatitis B. There is no vaccine for hepatitis C. If you are considered at risk for these diseases, get tested. SEEK MEDICAL CARE IF:   You cannot eat because you feel sick to your stomach.  Your abdomen or legs start to swell. Fluid might be building up.  You feel weaker or more tired than usual.  Your pain gets worse. SEEK IMMEDIATE MEDICAL CARE IF:   Your pain in your abdomen increases suddenly.  You  have nausea, vomiting, or diarrhea that does not go away.  You feel confused.  You feel very sleepy during the daytime.  You have any bleeding that does not stop quickly.  You have a fever. Document Released: 08/19/2008 Document Revised: 08/17/2013 Document Reviewed: 08/19/2008 [facility] Patient Information 2015 [place], [place]. This information is not intended to replace advice given to you by your health care provider. Make sure you discuss any questions you have with your health care provider.

## 2014-09-29 LAB — URINE CULTURE

## 2015-01-15 DEATH — deceased

## 2016-12-06 IMAGING — MR MR ABDOMEN WO/W CM MRCP
10 of 19 series · 19 of 48 positions shown · IV contrast (multihance)
Comparison: Abdominal CT 09/23/2014.

CLINICAL DATA: 80-year-old with decreased appetite, weight loss and
pruritis with elevated liver function studies. Biliary dilatation
and concern of cholangiocarcinoma on CT.

EXAM:
MRI ABDOMEN WITHOUT AND WITH CONTRAST (INCLUDING MRCP)
TECHNIQUE: Multiplanar multisequence MR imaging of the abdomen was performed
both before and after the administration of intravenous contrast.
Heavily T2-weighted images of the biliary and pancreatic ducts were
obtained, and three-dimensional MRCP images were rendered by post
processing.
CONTRAST:  20 ml MultiHance.

[Series 3: T2 · axial · 5.0mm · 0.78mm/px · 1 of 38 slices shown (1 of 2)]
[im 1/38]
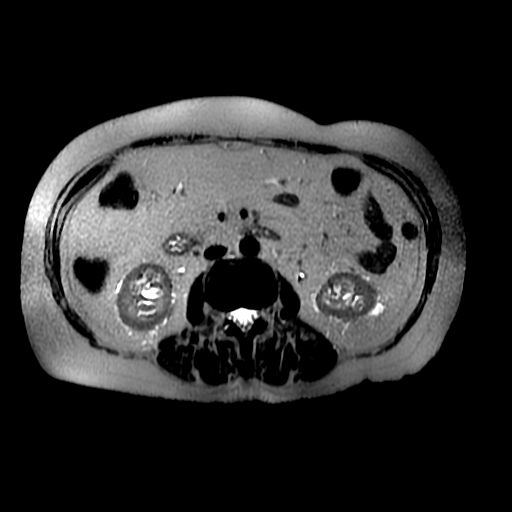

[Series 4: T2 fat-sat · axial · 5.0mm · 0.78mm/px · 1 of 40 slices shown]
[im 1/40]
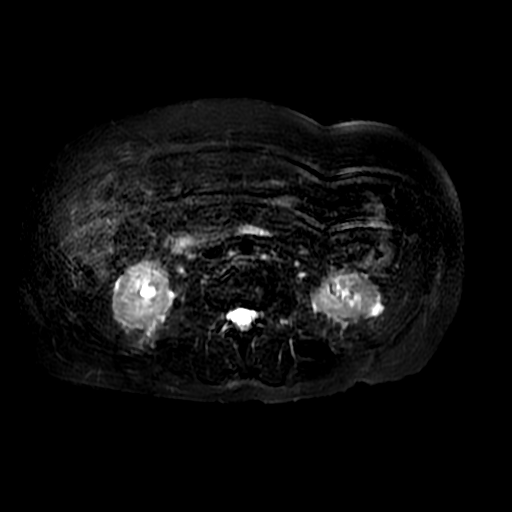

[Series 5: T2 · coronal · 5.0mm · 0.82mm/px · 1 of 41 slices shown (2 of 2)]
[im 1/41]
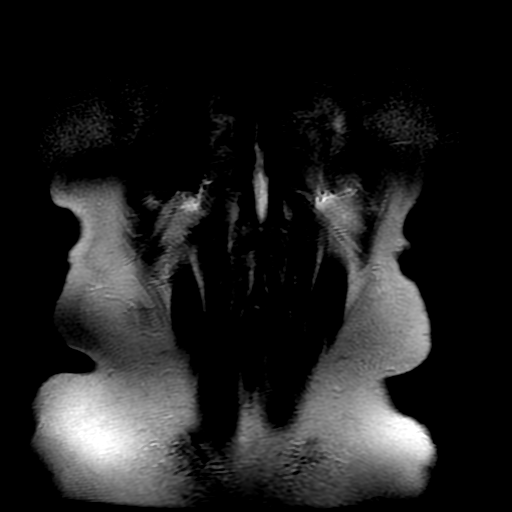

[Series 7: MRCP · coronal · 2.0mm · 0.70mm/px · 1 of 38 slices shown (1 of 2)]
[im 1/38]
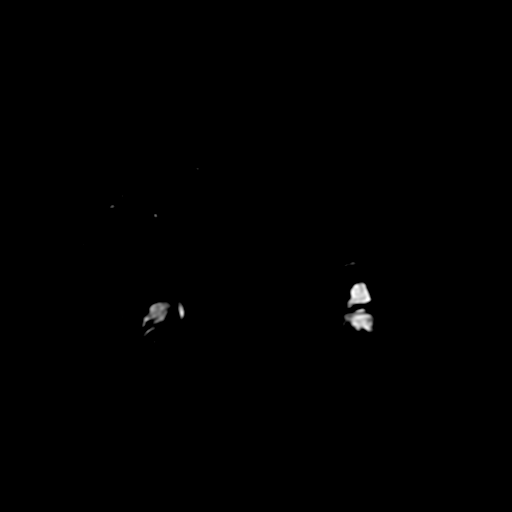

[Series 9: DWI b500 · axial · 6.0mm · 1.48mm/px · z∈[-92,+135]mm · 2 of 60 slices shown]
[im 1/60]
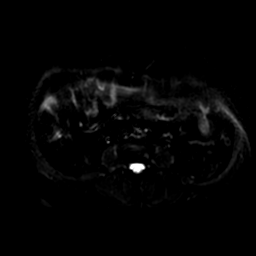
[im 60/60]
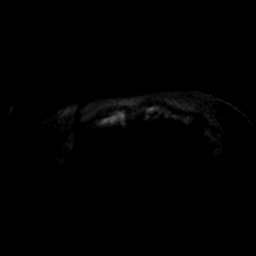

[Series 11: ax dualecho · axial · 5.0mm · 0.78mm/px · z∈[-69,+116]mm · 3 of 76 slices shown]
[im 1/76]
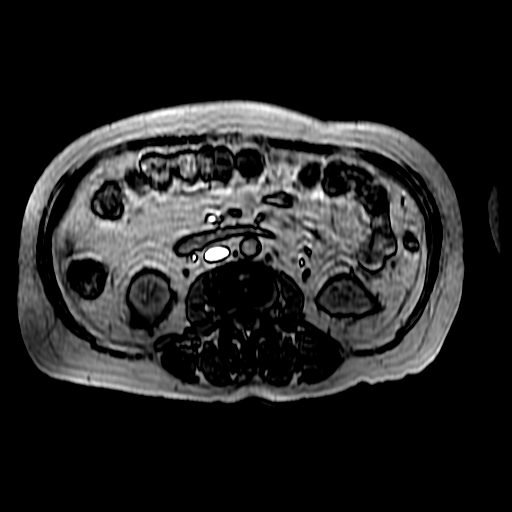
[im 38/76]
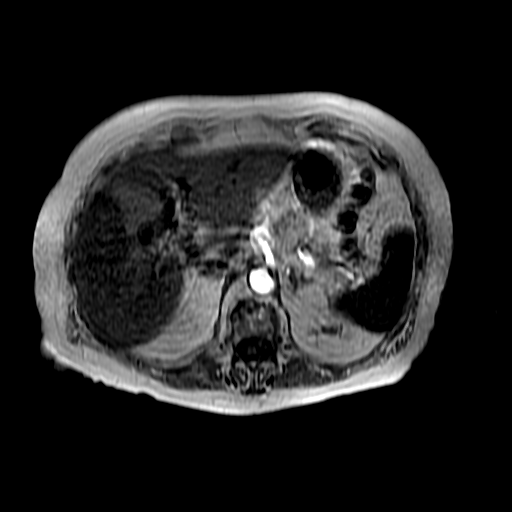
[im 76/76]
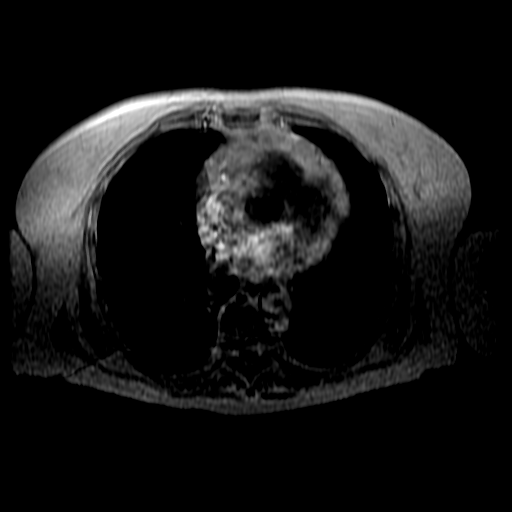

[Series 12: MRCP · sagittal · 50.0mm · 0.70mm/px · 1 of 12 slices shown (2 of 2)]
[im 1/12]
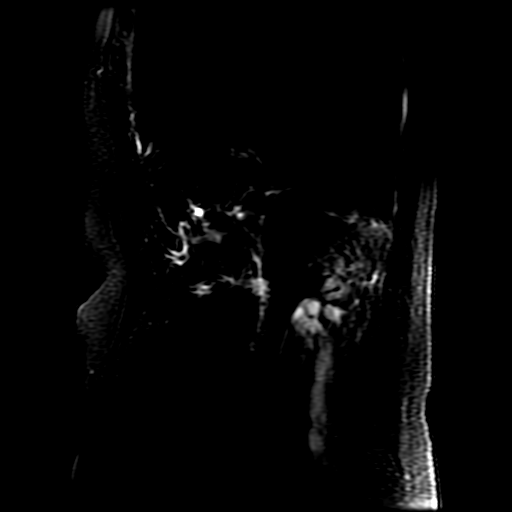

[Series 14: T1 dynamic post-contrast · coronal · 5.0mm · 0.78mm/px · 3 of 88 slices shown]
[im 1/88]
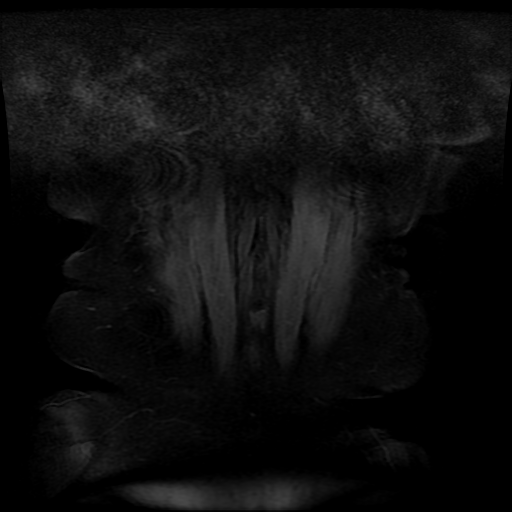
[im 44/88]
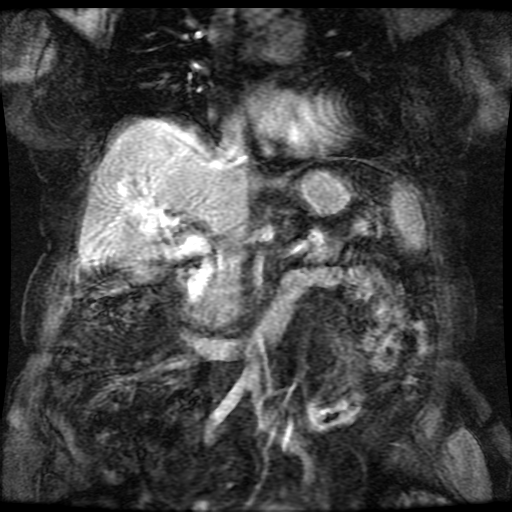
[im 88/88]
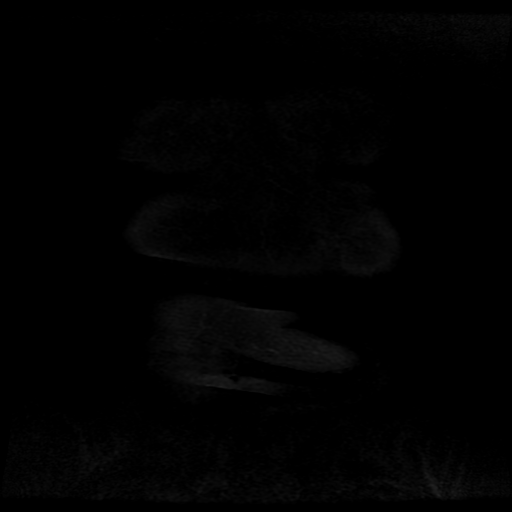

[Series 601: processed images · sagittal · 1.6mm · 0.64mm/px · 4 of 91 slices shown]
[im 1/91]
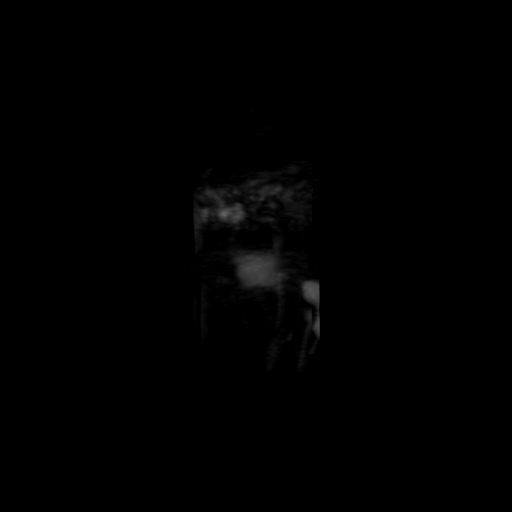
[im 31/91]
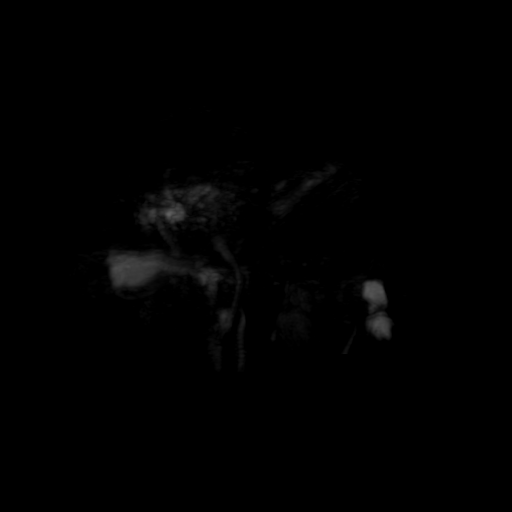
[im 61/91]
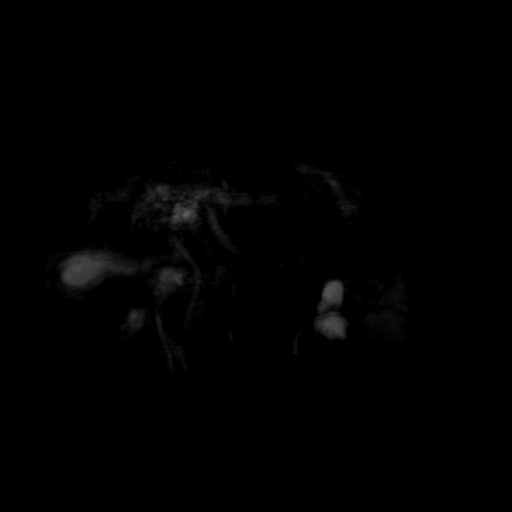
[im 91/91]
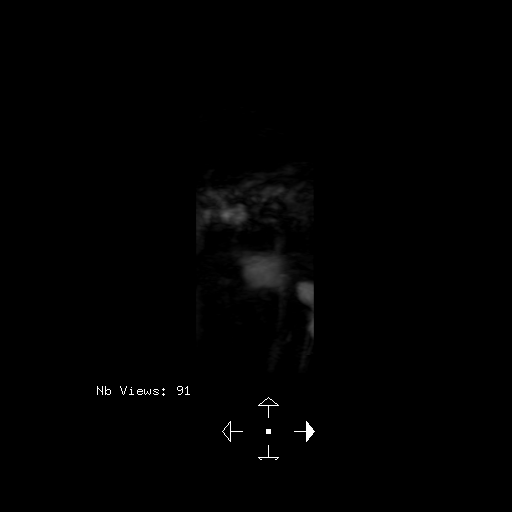

[Series 1300: T1 dynamic · axial · 5.0mm · 0.78mm/px · z∈[-44,+44]mm · 2 of 72 slices shown]
[im 1/72]
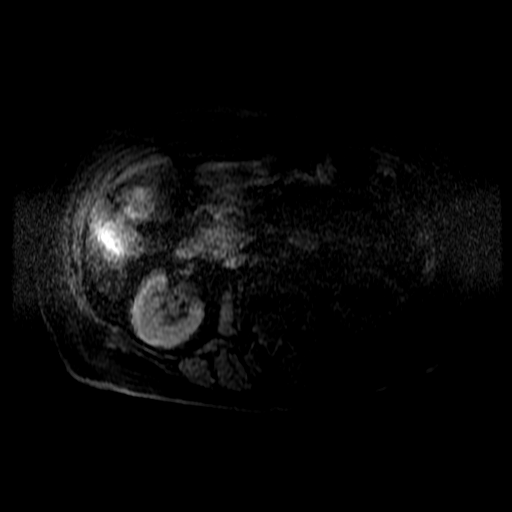
[im 36/72]
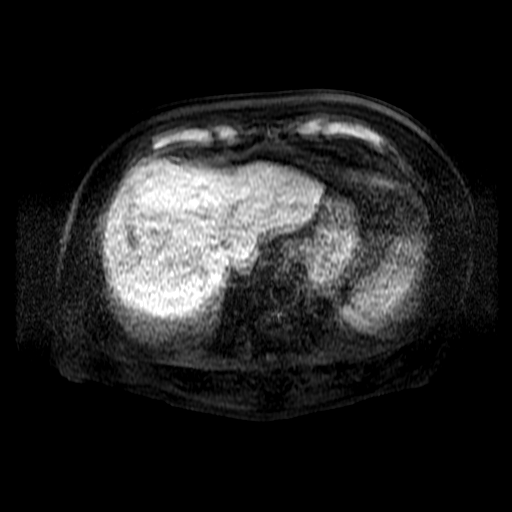

[19 of 48 positions shown; findings below may reference images not displayed]

Previous studies have been
merged ; an older study from 08/18/2013 is now available as well.
FINDINGS: Unfortunately, the study is moderately motion degraded. The patient
was not able to suspend respiration.

Lower chest:  Unremarkable.

Hepatobiliary: Again demonstrated is prominent segmental
intrahepatic biliary dilatation anteriorly in the right hepatic
lobe. The gallbladder is present without definite stones or wall
thickening. As demonstrated on recent CT, there is persistent
concern of a possible intra biliary lesion, measuring 1.6 cm on
image 21 of series 3. This could reflect a lymph node in the porta
hepatis causing extrinsic compression of the biliary system. On MRI,
the peripheral area of altered enhancement in segments 4B and 5,
adjacent to the gallbladder appears more mass-like, measuring 4.8 x
3.5 cm on image 22. This does demonstrate enhancement following
contrast. No other liver lesions identified. The extrahepatic
biliary system appears normal in caliber.

Pancreas: Unremarkable. No pancreatic ductal dilatation or
surrounding inflammatory changes.

Spleen: Normal in size without focal abnormality.

Adrenals/Urinary Tract: Both adrenal glands appear normal.Bilateral
renal parapelvic cysts. No hydronephrosis.

Stomach/Bowel: No evidence of bowel wall thickening, distention or
surrounding inflammatory change.

Vascular/Lymphatic: Stable mildly prominent lymph nodes within the
porta hepatis. Atherosclerosis better demonstrated by CT.

Other: None.

Musculoskeletal: Heterogeneous marrow signal throughout the spine
and sacrum without typical metastatic disease.
IMPRESSION: 1. The current examination does not better clarify the previously
demonstrated findings on CT due to motion. However, there is
persistent concern of malignant biliary obstruction from probable
cholangiocarcinoma. This could be secondary to a peripheral tumor
with central adenopathy or a central lesion with peripheral
metastasis.
2. Tissue sampling recommended.
3. No distant metastases identified.
